# Patient Record
Sex: Female | Born: 1980 | Race: White | Hispanic: No | Smoking: Former smoker
Health system: Southern US, Community
[De-identification: ages and names within clinical notes are randomized; demographics above are authoritative.]

## PROBLEM LIST (undated history)

## (undated) DIAGNOSIS — E079 Disorder of thyroid, unspecified: Secondary | ICD-10-CM

## (undated) DIAGNOSIS — F1010 Alcohol abuse, uncomplicated: Secondary | ICD-10-CM

## (undated) DIAGNOSIS — K7460 Unspecified cirrhosis of liver: Secondary | ICD-10-CM

## (undated) DIAGNOSIS — F329 Major depressive disorder, single episode, unspecified: Secondary | ICD-10-CM

## (undated) DIAGNOSIS — F32A Depression, unspecified: Secondary | ICD-10-CM

## (undated) DIAGNOSIS — F988 Other specified behavioral and emotional disorders with onset usually occurring in childhood and adolescence: Secondary | ICD-10-CM

## (undated) DIAGNOSIS — Z79899 Other long term (current) drug therapy: Secondary | ICD-10-CM

## (undated) HISTORY — PX: APPENDECTOMY: SHX54

## (undated) HISTORY — PX: ABDOMINAL HYSTERECTOMY: SHX81

## (undated) HISTORY — DX: Other long term (current) drug therapy: Z79.899

## (undated) HISTORY — PX: CHOLECYSTECTOMY: SHX55

---

## 2004-02-04 ENCOUNTER — Observation Stay: Payer: Self-pay

## 2004-02-26 ENCOUNTER — Inpatient Hospital Stay: Payer: Self-pay | Admitting: Unknown Physician Specialty

## 2007-05-28 ENCOUNTER — Ambulatory Visit: Payer: Self-pay | Admitting: Endocrinology

## 2008-04-28 ENCOUNTER — Ambulatory Visit: Payer: Self-pay | Admitting: Family Medicine

## 2008-10-21 ENCOUNTER — Ambulatory Visit: Payer: Self-pay | Admitting: Internal Medicine

## 2012-08-05 ENCOUNTER — Ambulatory Visit: Payer: Self-pay | Admitting: Obstetrics and Gynecology

## 2012-08-05 LAB — BASIC METABOLIC PANEL
Anion Gap: 10 (ref 7–16)
BUN: 8 mg/dL (ref 7–18)
Co2: 25 mmol/L (ref 21–32)
EGFR (African American): >60
EGFR (Non-African Amer.): >60
Glucose: 79 mg/dL (ref 65–99)
Potassium: 3.7 mmol/L (ref 3.5–5.1)
Sodium: 139 mmol/L (ref 136–145)

## 2012-08-05 LAB — CBC
HCT: 42.5 % (ref 35.0–47.0)
HGB: 14.6 g/dL (ref 12.0–16.0)
MCH: 35.3 pg — ABNORMAL HIGH (ref 26.0–34.0)
MCHC: 34.3 g/dL (ref 32.0–36.0)
RBC: 4.13 10*6/uL (ref 3.80–5.20)
WBC: 6.4 10*3/uL (ref 3.6–11.0)

## 2012-08-05 LAB — HEPATIC FUNCTION PANEL A (ARMC)
Albumin: 4.1 g/dL (ref 3.4–5.0)
Bilirubin,Total: 0.9 mg/dL (ref 0.2–1.0)
SGOT(AST): 170 U/L — ABNORMAL HIGH (ref 15–37)
SGPT (ALT): 149 U/L — ABNORMAL HIGH (ref 12–78)
Total Protein: 7.5 g/dL (ref 6.4–8.2)

## 2012-08-14 ENCOUNTER — Ambulatory Visit: Payer: Self-pay | Admitting: Obstetrics and Gynecology

## 2012-08-15 LAB — CBC WITH DIFFERENTIAL/PLATELET
Eosinophil #: 0 10*3/uL (ref 0.0–0.7)
HCT: 35.3 % (ref 35.0–47.0)
HGB: 11.8 g/dL — ABNORMAL LOW (ref 12.0–16.0)
MCV: 103 fL — ABNORMAL HIGH (ref 80–100)
Monocyte #: 0.3 x10 3/mm (ref 0.2–0.9)
Monocyte %: 5.9 %
Neutrophil #: 3.6 10*3/uL (ref 1.4–6.5)
Neutrophil %: 74.9 %
Platelet: 106 10*3/uL — ABNORMAL LOW (ref 150–440)
RDW: 13.6 % (ref 11.5–14.5)

## 2012-08-15 LAB — BASIC METABOLIC PANEL
Calcium, Total: 8 mg/dL — ABNORMAL LOW (ref 8.5–10.1)
Chloride: 107 mmol/L (ref 98–107)
Creatinine: 0.62 mg/dL (ref 0.60–1.30)
EGFR (African American): >60
Glucose: 104 mg/dL — ABNORMAL HIGH (ref 65–99)
Osmolality: 271 (ref 275–301)

## 2012-08-19 LAB — URINALYSIS, COMPLETE
Ph: 6 (ref 4.5–8.0)
Protein: NEGATIVE
RBC,UR: 1 /HPF (ref 0–5)
Squamous Epithelial: 1
WBC UR: 3 /HPF (ref 0–5)

## 2012-08-19 LAB — CBC
HCT: 36.4 % (ref 35.0–47.0)
MCH: 34.1 pg — ABNORMAL HIGH (ref 26.0–34.0)
MCHC: 33.5 g/dL (ref 32.0–36.0)
MCV: 102 fL — ABNORMAL HIGH (ref 80–100)
WBC: 6.8 10*3/uL (ref 3.6–11.0)

## 2012-08-19 LAB — PATHOLOGY REPORT

## 2012-08-19 LAB — COMPREHENSIVE METABOLIC PANEL
Albumin: 3.1 g/dL — ABNORMAL LOW (ref 3.4–5.0)
Alkaline Phosphatase: 80 U/L (ref 50–136)
Anion Gap: 7 (ref 7–16)
Bilirubin,Total: 0.4 mg/dL (ref 0.2–1.0)
Calcium, Total: 8.3 mg/dL — ABNORMAL LOW (ref 8.5–10.1)
Chloride: 103 mmol/L (ref 98–107)
Co2: 26 mmol/L (ref 21–32)
Glucose: 93 mg/dL (ref 65–99)
Osmolality: 268 (ref 275–301)
Potassium: 3.5 mmol/L (ref 3.5–5.1)
Total Protein: 6.6 g/dL (ref 6.4–8.2)

## 2012-08-19 LAB — LIPASE, BLOOD: Lipase: 46 U/L — ABNORMAL LOW (ref 73–393)

## 2012-08-20 ENCOUNTER — Observation Stay: Payer: Self-pay | Admitting: Obstetrics and Gynecology

## 2012-08-20 LAB — CBC WITH DIFFERENTIAL/PLATELET
Eosinophil #: 0.2 10*3/uL (ref 0.0–0.7)
Eosinophil %: 3.3 %
HGB: 11.7 g/dL — ABNORMAL LOW (ref 12.0–16.0)
Lymphocyte #: 1.2 10*3/uL (ref 1.0–3.6)
Lymphocyte %: 24.1 %
MCV: 102 fL — ABNORMAL HIGH (ref 80–100)
Neutrophil #: 3 10*3/uL (ref 1.4–6.5)
Neutrophil %: 60.7 %
Platelet: 180 10*3/uL (ref 150–440)

## 2012-08-25 LAB — CULTURE, BLOOD (SINGLE)

## 2012-10-29 ENCOUNTER — Ambulatory Visit: Payer: Self-pay | Admitting: Family Medicine

## 2012-11-21 DIAGNOSIS — N83209 Unspecified ovarian cyst, unspecified side: Secondary | ICD-10-CM | POA: Insufficient documentation

## 2012-11-21 DIAGNOSIS — F1020 Alcohol dependence, uncomplicated: Secondary | ICD-10-CM | POA: Insufficient documentation

## 2012-11-21 DIAGNOSIS — IMO0001 Reserved for inherently not codable concepts without codable children: Secondary | ICD-10-CM | POA: Insufficient documentation

## 2012-11-21 DIAGNOSIS — B182 Chronic viral hepatitis C: Secondary | ICD-10-CM | POA: Insufficient documentation

## 2014-08-26 NOTE — Op Note (Signed)
PATIENT NAMEKAROLE, [name] MR#:  [id number] DATE OF BIRTH:  1980-11-03  DATE OF PROCEDURE:  08/14/2012  PREOPERATIVE DIAGNOSIS: Menorrhagia.   POSTOPERATIVE DIAGNOSIS: Menorrhagia.   OPERATION PERFORMED: Da Vinci assisted total laparoscopic hysterectomy as well as cystoscopy.   ANESTHESIA USED: General.   PRIMARY SURGEON: Florina Ou. Bonney Aid, MD   ASSISTANT: Nira Conn, MD   ESTIMATED BLOOD LOSS: 50 mL.  OPERATIVE FLUIDS: 1400 mL of crystalloid.   URINE OUTPUT: 150 mL of clear urine.   PREOPERATIVE ANTIBIOTICS: 2 grams of Ancef.   DRAINS OR TUBES: Foley to gravity drainage.   IMPLANTS: None.   SPECIMENS REMOVED: Uterus and cervix as well as right tube and left tube.   INTRAOPERATIVE FINDINGS: Normal tubes and ovaries bilaterally with a normal-appearing uterus. Enlarged liver extending well beyond midline with mottled appearance, consistent with known history of cirrhosis. Cystoscopy at the end of the case revealed an intact bladder dome with bilateral ureteral efflux.   COMPLICATIONS: None.   PATIENT CONDITION FOLLOWING PROCEDURE: Stable.   PROCEDURE IN DETAIL: Risks, benefits and alternatives of the procedure were discussed with the patient prior to proceeding to the Operating Room. The patient was taken to the Operating Room where she was administered general endotracheal anesthesia. She was positioned in the dorsal lithotomy position using Allen stirrups. Prior to proceeding with the case, the patient was tested in steep Trendelenburg with acceptable peak inspiratory pressures. Following draping, attention was turned to the patient's vagina. A sterile speculum was placed. The anterior lip of the cervix was visualized. The cervix was serially dilated using Pratt dilators to allow placement of a large VCare. Following intrauterine placement of the VCare, the speculum was removed. The VCare cup was noted to be flush with the cervix prior to continuing the case. Attention was then  turned to the patient's abdomen. A stab incision was made 20 cm above the pubic symphysis, approximately 4 cm superior to the umbilicus. This was infiltrated with 0.5% lidocaine. A Veress needle was then used to gain entry into the intra-abdominal cavity. Two clicks were heard. Water drop test was confirmatory for intraperitoneal placement. The opening pressure was noted to be 4 mmHg. Insufflation was undertaken until a pressure of 15 mmHg was reached. An 8 mm camera trocar was then introduced through the midline incision. Two 8 mm assistant ports were placed laterally under direct visualization, as well as a right 11 mm assistant port. Following placement of the ports, the [employer] robot was docked. A fenestrated bipolar was placed into the arm 2 with a monopolar scissor in arm 1. Inspection of the abdomen and pelvis noted the above findings. Attention was turned to the patient's right tube. The right tube mesosalpinx was cauterized using the fenestrated bipolar and then excised using the monopolar scissors. The utero-ovarian ligament was then cauterized and also transected using the monopolar scissors. Next, the round ligament on the right was cauterized and transected using the monopolar scissors. The anterior leaf of the broad ligament was then dissected down to the level of the internal cervical os and a bladder flap was begun. The posterior mesosalpinx was then skeletonized down to the level of the uterosacral ligament. The uterine artery on the right was skeletonized and then cauterized using the fenestrated bipolar before being transected with the monopolar scissors. The uterine pedicle was then lateralized using the monopolar scissors. There was some bleeding and some lateral cauterization had to be undertaken. The same procedure was then repeated on the patient's left. The  bladder flap anteriorly was brought down to meet the previously created bladder flap, and the bladder was further dissected off the  pubocervical fascia. An anterior colpotomy was then made with the monopolar scissors and using the KOH ring and staying within the groove of the KOH ring, the uterine specimen was excised. The uterus was then delivered vaginally. The cuff was inspected and noted to be hemostatic. Cuff closure was undertaken using two 0 V-Loc 180 sutures, starting at the right cuff edge and working medially, which was then followed by the left cuff edge also working medially to meet in the center. Following this, the pelvis was irrigated and all pedicles inspected and noted to be hemostatic. The pneumoperitoneum was released. The 11 mm port site was closed with a 0 Vicryl on a GU needle. The skin incisions were then closed using Dermabond. Attention was then turned below for the cystoscopy portion of the case. Indigo carmine had been administered to the patient once closure was begun. The cystoscopy revealed an intact bladder dome with no tenting or defect in the bladder and bilateral ureteral efflux of methylene blue dye. The cystoscope was removed and the Foley catheter was replaced. Sponge, needle and instrument counts were correct x 2. The patient tolerated the procedure well, was taken to the recovery room in stable condition.   ____________________________ Florina OuAndreas M. Bonney AidStaebler, MD ams:jm D: 08/14/2012 20:52:32 ET T: 08/15/2012 14:24:18 ET JOB#: [id number]  cc: Florina OuAndreas M. Bonney AidStaebler, MD, <Dictator> Carmel SacramentoANDREAS Cathrine MusterM Seiya Silsby MD ELECTRONICALLY SIGNED 08/19/2012 15:28

## 2015-03-14 ENCOUNTER — Ambulatory Visit
Admission: EM | Admit: 2015-03-14 | Discharge: 2015-03-14 | Disposition: A | Discharge status: Home / Self Care | Payer: Medicaid Other | Attending: Family Medicine | Admitting: Family Medicine

## 2015-03-14 ENCOUNTER — Encounter: Payer: Self-pay | Admitting: Emergency Medicine

## 2015-03-14 ENCOUNTER — Emergency Department: Payer: Medicaid Other

## 2015-03-14 ENCOUNTER — Emergency Department
Admission: EM | Admit: 2015-03-14 | Discharge: 2015-03-15 | Disposition: A | Discharge status: Home / Self Care | Payer: Medicaid Other | Attending: Emergency Medicine | Admitting: Emergency Medicine

## 2015-03-14 DIAGNOSIS — Z880 Allergy status to penicillin: Secondary | ICD-10-CM | POA: Insufficient documentation

## 2015-03-14 DIAGNOSIS — F1020 Alcohol dependence, uncomplicated: Secondary | ICD-10-CM | POA: Insufficient documentation

## 2015-03-14 DIAGNOSIS — F17210 Nicotine dependence, cigarettes, uncomplicated: Secondary | ICD-10-CM | POA: Insufficient documentation

## 2015-03-14 DIAGNOSIS — Z3202 Encounter for pregnancy test, result negative: Secondary | ICD-10-CM | POA: Diagnosis not present

## 2015-03-14 DIAGNOSIS — R1084 Generalized abdominal pain: Secondary | ICD-10-CM | POA: Insufficient documentation

## 2015-03-14 DIAGNOSIS — Z720 Tobacco use: Secondary | ICD-10-CM | POA: Insufficient documentation

## 2015-03-14 DIAGNOSIS — R188 Other ascites: Secondary | ICD-10-CM

## 2015-03-14 DIAGNOSIS — K5900 Constipation, unspecified: Secondary | ICD-10-CM | POA: Insufficient documentation

## 2015-03-14 DIAGNOSIS — K7030 Alcoholic cirrhosis of liver without ascites: Secondary | ICD-10-CM | POA: Diagnosis not present

## 2015-03-14 DIAGNOSIS — K7031 Alcoholic cirrhosis of liver with ascites: Secondary | ICD-10-CM | POA: Diagnosis not present

## 2015-03-14 DIAGNOSIS — R109 Unspecified abdominal pain: Secondary | ICD-10-CM | POA: Diagnosis present

## 2015-03-14 HISTORY — DX: Unspecified cirrhosis of liver: K74.60

## 2015-03-14 HISTORY — DX: Depression, unspecified: F32.A

## 2015-03-14 HISTORY — DX: Alcohol abuse, uncomplicated: F10.10

## 2015-03-14 HISTORY — DX: Major depressive disorder, single episode, unspecified: F32.9

## 2015-03-14 HISTORY — DX: Disorder of thyroid, unspecified: E07.9

## 2015-03-14 HISTORY — DX: Other specified behavioral and emotional disorders with onset usually occurring in childhood and adolescence: F98.8

## 2015-03-14 LAB — COMPREHENSIVE METABOLIC PANEL
ALT: 49 U/L (ref 14–54)
ALT: 52 U/L (ref 14–54)
AST: 100 U/L — ABNORMAL HIGH (ref 15–41)
AST: 103 U/L — AB (ref 15–41)
Albumin: 4.4 g/dL (ref 3.5–5.0)
Albumin: 4.7 g/dL (ref 3.5–5.0)
Alkaline Phosphatase: 50 U/L (ref 38–126)
Alkaline Phosphatase: 56 U/L (ref 38–126)
Anion gap: 12 (ref 5–15)
Anion gap: 7 (ref 5–15)
BILIRUBIN TOTAL: 0.7 mg/dL (ref 0.3–1.2)
BUN: 7 mg/dL (ref 6–20)
BUN: 7 mg/dL (ref 6–20)
CHLORIDE: 105 mmol/L (ref 101–111)
CO2: 26 mmol/L (ref 22–32)
CO2: 28 mmol/L (ref 22–32)
Calcium: 8.8 mg/dL — ABNORMAL LOW (ref 8.9–10.3)
Calcium: 8.7 mg/dL — ABNORMAL LOW (ref 8.9–10.3)
Chloride: 103 mmol/L (ref 101–111)
Creatinine, Ser: 0.54 mg/dL (ref 0.44–1.00)
Creatinine, Ser: 0.54 mg/dL (ref 0.44–1.00)
GFR calc Af Amer: >60 mL/min (ref >60)
Glucose, Bld: 85 mg/dL (ref 65–99)
Glucose, Bld: 107 mg/dL — ABNORMAL HIGH (ref 65–99)
POTASSIUM: 3.6 mmol/L (ref 3.5–5.1)
POTASSIUM: 3.4 mmol/L — AB (ref 3.5–5.1)
Sodium: 141 mmol/L (ref 135–145)
Sodium: 140 mmol/L (ref 135–145)
TOTAL PROTEIN: 7.4 g/dL (ref 6.5–8.1)
Total Bilirubin: 0.6 mg/dL (ref 0.3–1.2)
Total Protein: 7.6 g/dL (ref 6.5–8.1)

## 2015-03-14 LAB — CBC WITH DIFFERENTIAL/PLATELET
BASOS ABS: 0.1 10*3/uL (ref 0–0.1)
BASOS PCT: 1 %
Basophils Absolute: 0 10*3/uL (ref 0–0.1)
Basophils Relative: 1 %
EOS PCT: 2 %
EOS PCT: 2 %
Eosinophils Absolute: 0.1 10*3/uL (ref 0–0.7)
Eosinophils Absolute: 0.1 10*3/uL (ref 0–0.7)
HCT: 40.1 % (ref 35.0–47.0)
HCT: 41.7 % (ref 35.0–47.0)
Hemoglobin: 13.7 g/dL (ref 12.0–16.0)
Hemoglobin: 13.8 g/dL (ref 12.0–16.0)
LYMPHS ABS: 3.5 10*3/uL (ref 1.0–3.6)
LYMPHS PCT: 61 %
Lymphocytes Relative: 57 %
Lymphs Abs: 2.9 10*3/uL (ref 1.0–3.6)
MCH: 34.3 pg — AB (ref 26.0–34.0)
MCH: 33.8 pg (ref 26.0–34.0)
MCHC: 34.2 g/dL (ref 32.0–36.0)
MCHC: 33.2 g/dL (ref 32.0–36.0)
MCV: 100.3 fL — AB (ref 80.0–100.0)
MCV: 101.8 fL — AB (ref 80.0–100.0)
MONO ABS: 0.3 10*3/uL (ref 0.2–0.9)
MONOS PCT: 7 %
Monocytes Absolute: 0.4 10*3/uL (ref 0.2–0.9)
Monocytes Relative: 6 %
Neutro Abs: 2 10*3/uL (ref 1.4–6.5)
Neutro Abs: 1.5 10*3/uL (ref 1.4–6.5)
Neutrophils Relative %: 33 %
Neutrophils Relative %: 30 %
PLATELETS: 132 10*3/uL — AB (ref 150–440)
PLATELETS: 132 10*3/uL — AB (ref 150–440)
RBC: 4 MIL/uL (ref 3.80–5.20)
RBC: 4.1 MIL/uL (ref 3.80–5.20)
RDW: 14.6 % — AB (ref 11.5–14.5)
RDW: 14.6 % — AB (ref 11.5–14.5)
WBC: 6 10*3/uL (ref 3.6–11.0)
WBC: 4.8 10*3/uL (ref 3.6–11.0)

## 2015-03-14 LAB — ETHANOL: Alcohol, Ethyl (B): 243 mg/dL — ABNORMAL HIGH (ref <5)

## 2015-03-14 LAB — URINALYSIS COMPLETE WITH MICROSCOPIC (ARMC ONLY)
BILIRUBIN URINE: NEGATIVE
Bacteria, UA: NONE SEEN
Bilirubin Urine: NEGATIVE
Glucose, UA: NEGATIVE mg/dL
Glucose, UA: NEGATIVE mg/dL
HGB URINE DIPSTICK: NEGATIVE
Hgb urine dipstick: NEGATIVE
Ketones, ur: NEGATIVE mg/dL
LEUKOCYTES UA: NEGATIVE
Leukocytes, UA: NEGATIVE
NITRITE: NEGATIVE
NITRITE: NEGATIVE
PH: 5 (ref 5.0–8.0)
PROTEIN: NEGATIVE mg/dL
Protein, ur: NEGATIVE mg/dL
RBC / HPF: NONE SEEN RBC/hpf (ref <3)
SPECIFIC GRAVITY, URINE: 1.01 (ref 1.005–1.030)
SPECIFIC GRAVITY, URINE: 1.008 (ref 1.005–1.030)
pH: 5 (ref 5.0–8.0)

## 2015-03-14 LAB — AMYLASE: AMYLASE: 99 U/L (ref 28–100)

## 2015-03-14 LAB — LIPASE, BLOOD
LIPASE: 30 U/L (ref 11–51)
LIPASE: 32 U/L (ref 11–51)

## 2015-03-14 LAB — POCT PREGNANCY, URINE: Preg Test, Ur: NEGATIVE

## 2015-03-14 LAB — PREGNANCY, URINE: Preg Test, Ur: NEGATIVE

## 2015-03-14 MED ORDER — OXYCODONE HCL 5 MG PO TABS
5.0000 mg | ORAL_TABLET | Freq: Once | ORAL | Status: AC
  Administered 2015-03-14: 5 mg via ORAL
  Filled 2015-03-14: qty 1

## 2015-03-14 MED ORDER — NICOTINE 21 MG/24HR TD PT24
21.0000 mg | MEDICATED_PATCH | Freq: Once | TRANSDERMAL | Status: DC
  Administered 2015-03-14: 21 mg via TRANSDERMAL
  Filled 2015-03-14: qty 1

## 2015-03-14 NOTE — Discharge Instructions (Signed)
Alcoholic Liver Disease The liver is an organ that converts food into energy, absorbs vitamins from food, removes toxins from the blood, and makes proteins. Alcoholic liver disease happens when the liver becomes damaged due to alcohol consumption and it stops working properly. CAUSES This condition is caused by drinking too much alcohol over a number of years. RISK FACTORS This condition is more likely to develop in:  Women.  People who have a family history of the disease.  People who have poor nutrition.  People who are obese. SYMPTOMS Early symptoms of this condition include:  Fatigue.  Weakness.  Abdominal discomfort on the upper right side. Symptoms of moderate disease include:  Fever.  Yellow, pale, or darkening skin.  Abdominal pain.  Nausea and vomiting.  Weight loss.  Loss of appetite. Symptoms of advanced disease include:  Abdominal swelling.  Nosebleeds or bleeding gums.  Itchy skin.  Enlarged fingertips (clubbing).  Light-colored stools that smell very bad (steatorrhea).  Mood changes.  Feeling agitated.  Confusion.  Trouble concentrating. Some people do not have symptoms until the condition becomes severe. Symptoms are often worse after heavy drinking. DIAGNOSIS This condition is diagnosed with:  A physical exam.  Blood tests.  Taking a sample of liver tissue to examine under a microscope (liver biopsy). You may also have other tests, including:   X-rays.  Ultrasound. TREATMENT Treatment may include:  Stopping alcohol use to allow the liver to heal.  Joining a support group or meeting with a counselor.  Medicines to reduce inflammation. These may be recommended if the disease is severe.  A liver transplant. This is the only treatment if the disease is very severe.  Nutritional therapy. This may involve:  Taking vitamins.  Eating foods that are high in thiamine, such as whole-wheat cereals, pork, and raw  vegetables.  Eating foods that have a lot of folic acid, such as vegetables, fruits, meats, beans, nuts, and dairy foods.  Eating a diet that includes carbohydrate-rich foods, such as yogurt, beans, potatoes, and rice. HOME CARE INSTRUCTIONS  Do not drink alcohol.  Take medicines only as directed by your health care provider.  Take vitamins only as directed by your health care provider.  Follow any diet instructions that are given to you by your health care provider. SEEK MEDICAL CARE IF:  You have a fever.  You have shortness of breath or have difficulty breathing.  You have bright red blood in your stool, or you have black, tarry stools.  You are vomiting blood.  Your skin color becomes more yellow, pale, or dark.  You develop headaches.  You have trouble thinking.  You have problems balancing or walking.   This information is not intended to replace advice given to you by your health care provider. Make sure you discuss any questions you have with your health care provider.   Document Released: 05/13/2014 Document Reviewed: 05/13/2014 Elsevier Interactive Patient Education 2016 [employer].  Cirrhosis Cirrhosis is long-term (chronic) liver injury. The liver is your largest internal organ, and it performs many functions. The liver converts food into energy, removes toxic material from your blood, makes important proteins, and absorbs necessary vitamins from your diet. If you have cirrhosis, it means many of your healthy liver cells have been replaced by scar tissue. This prevents blood from flowing through your liver, which makes it difficult for your liver to function. This scarring is not reversible, but treatment can prevent it from getting worse.  CAUSES  Hepatitis C and long-term  alcohol abuse are the most common causes of cirrhosis. Other causes include:  Nonalcoholic fatty liver disease.  Hepatitis B infection.  Autoimmune hepatitis.  Diseases that cause  blockage of ducts inside the liver.  Inherited liver diseases.  Reactions to certain long-term medicines.  Parasitic infections.  Long-term exposure to certain toxins. RISK FACTORS You may have a higher risk of cirrhosis if you:  Have certain hepatitis viruses.  Abuse alcohol, especially if you are female.  Are overweight.  Share needles.  Have unprotected sex with someone who has hepatitis. SYMPTOMS  You may not have any signs and symptoms at first. Symptoms may not develop until the damage to your liver starts to get worse. Signs and symptoms of cirrhosis may include:   Tenderness in the right-upper part of your abdomen.  Weakness and tiredness (fatigue).  Loss of appetite.  Nausea.  Weight loss and muscle loss.  Itchiness.  Yellow skin and eyes (jaundice).  Buildup of fluid in the abdomen (ascites).  Swelling of the feet and ankles (edema).  Appearance of tiny blood vessels under the skin.  Mental confusion.  Easy bruising and bleeding. DIAGNOSIS  Your health care provider may suspect cirrhosis based on your symptoms and medical history, especially if you have other medical conditions or a history of alcohol abuse. Your health care provider will do a physical exam to feel your liver and check for signs of cirrhosis. Your health care provider may perform other tests, including:   Blood tests to check:   Whether you have hepatitis B or C.   Kidney function.  Liver function.  Imaging tests such as:  MRI or CT scan to look for changes seen in advanced cirrhosis.  Ultrasound to see if normal liver tissue is being replaced by scar tissue.  A procedure using a long needle to take a sample of liver tissue (biopsy) for examination under a microscope. Liver biopsy can confirm the diagnosis of cirrhosis.  TREATMENT  Treatment depends on how damaged your liver is and what caused the damage. Treatment may include treating cirrhosis symptoms or treating the  underlying causes of the condition to try to slow the progression of the damage. Treatment may include:  Making lifestyle changes, such as:   Eating a healthy diet.  Restricting salt intake.  Maintaining a healthy weight.   Not abusing drugs or alcohol.  Taking medicines to:  Treat liver infections or other infections.  Control itching.  Reduce fluid buildup.  Reduce certain blood toxins.  Reduce risk of bleeding from enlarged blood vessels in the stomach or esophagus (varices).  If varices are causing bleeding problems, you may need treatment with a procedure that ties up the vessels causing them to fall off (band ligation).  If cirrhosis is causing your liver to fail, your health care provider may recommend a liver transplant.  Other treatments may be recommended depending on any complications of cirrhosis, such as liver-related kidney failure (hepatorenal syndrome). HOME CARE INSTRUCTIONS   Take medicines only as directed by your health care provider. Do not use drugs that are toxic to your liver. Ask your health care provider before taking any new medicines, including over-the-counter medicines.   Rest as needed.  Eat a well-balanced diet. Ask your health care provider or dietitian for more information.   You may have to follow a low-salt diet or restrict your water intake as directed.  Do not drink alcohol. This is especially important if you are taking acetaminophen.  Keep all follow-up visits  as directed by your health care provider. This is important. SEEK MEDICAL CARE IF:  You have fatigue or weakness that is getting worse.  You develop swelling of the hands, feet, legs, or face.  You have a fever.  You develop loss of appetite.  You have nausea or vomiting.  You develop jaundice.  You develop easy bruising or bleeding. SEEK IMMEDIATE MEDICAL CARE IF:  You vomit bright red blood or a material that looks like coffee grounds.  You have blood in  your stools.  Your stools appear black and tarry.  You become confused.  You have chest pain or trouble breathing.   This information is not intended to replace advice given to you by your health care provider. Make sure you discuss any questions you have with your health care provider.   Document Released: 04/22/2005 Document Revised: 05/13/2014 Document Reviewed: 12/29/2013 Elsevier Interactive Patient Education [employer]2016 Elsevier Inc.

## 2015-03-14 NOTE — ED Notes (Signed)
Spoke with lab about ethanol results for pt, lab states they are working on it.

## 2015-03-14 NOTE — ED Provider Notes (Signed)
[facility] Emergency Department Provider Note     Time seen: ----------------------------------------- 10:24 PM on 03/14/2015 -----------------------------------------    I have reviewed the triage vital signs and the nursing notes.   HISTORY  Chief Complaint Abdominal Pain    HPI [name] is a 34 y.o. female who presents to ER for abdominal pain. Patient states she's been pain for about a week, went to [facility] urgent care today was diagnosed with generalized abdominal pain and cirrhosis of liver. Patient admits to drinking half a pint of liquor today. Patient's been taking heating pad and ibuprofen but that hasn't helped. Nothing makes her abdominal pain better or worse.   Past Medical History  Diagnosis Date  . Alcohol abuse   . Thyroid disease   . ADD (attention deficit disorder)   . Cirrhosis (HCC)   . Depression     There are no active problems to display for this patient.   Past Surgical History  Procedure Laterality Date  . Appendectomy    . Cholecystectomy    . Abdominal hysterectomy      Allergies Penicillins  Social History Social History  Substance Use Topics  . Smoking status: Current Every Day Smoker -- 2.00 packs/day    Types: Cigarettes  . Smokeless tobacco: None  . Alcohol Use: Yes     Comment: excessively    Review of Systems Constitutional: Negative for fever. Eyes: Negative for visual changes. ENT: Negative for sore throat. Cardiovascular: Negative for chest pain. Respiratory: Negative for shortness of breath. Gastrointestinal: Positive for abdominal pain, negative for vomiting or diarrhea. Positive for constipation Genitourinary: Negative for dysuria. Musculoskeletal: Negative for back pain. Skin: Negative for rash. Neurological: Negative for headaches, focal weakness or numbness.  10-point ROS otherwise negative.  ____________________________________________   PHYSICAL EXAM:  VITAL SIGNS: ED  Triage Vitals  Enc Vitals Group     BP --      Pulse --      Resp --      Temp --      Temp src --      SpO2 --      Weight --      Height --      Head Cir --      Peak Flow --      Pain Score 03/14/15 2013 7     Pain Loc --      Pain Edu? --      Excl. in GC? --     Constitutional: Alert and oriented. Well appearing and in no distress. Patient appears intoxicated Eyes: Conjunctivae are normal. PERRL. Normal extraocular movements. ENT   Head: Normocephalic and atraumatic.   Nose: No congestion/rhinnorhea.   Mouth/Throat: Mucous membranes are moist.   Neck: No stridor. Cardiovascular: Normal rate, regular rhythm. Normal and symmetric distal pulses are present in all extremities. No murmurs, rubs, or gallops. Respiratory: Normal respiratory effort without tachypnea nor retractions. Breath sounds are clear and equal bilaterally. No wheezes/rales/rhonchi. Gastrointestinal: Distention, mild right-sided abdominal tenderness. No rebound or guarding. Normal bowel sounds. Musculoskeletal: Nontender with normal range of motion in all extremities. No joint effusions.  No lower extremity tenderness nor edema. Neurologic:  Normal speech and language. No gross focal neurologic deficits are appreciated. Speech is normal. No gait instability. Skin:  Skin is warm, dry and intact. No rash noted. Psychiatric: Mood and affect are normal. Speech and behavior are normal. Patient exhibits appropriate insight and judgment.  ____________________________________________  ED COURSE:  Pertinent labs &  imaging results that were available during my care of the patient were reviewed by me and considered in my medical decision making (see chart for details). Patient is in no acute distress, likely cirrhosis with chronic alcoholism. ____________________________________________    LABS (pertinent positives/negatives)  Labs Reviewed  CBC WITH DIFFERENTIAL/PLATELET - Abnormal; Notable for the  following:    MCV 101.8 (*)    RDW 14.6 (*)    Platelets 132 (*)    All other components within normal limits  COMPREHENSIVE METABOLIC PANEL - Abnormal; Notable for the following:    Potassium 3.4 (*)    Glucose, Bld 107 (*)    Calcium 8.7 (*)    AST 103 (*)    All other components within normal limits  URINALYSIS COMPLETEWITH MICROSCOPIC ([facility] ONLY) - Abnormal; Notable for the following:    Color, Urine YELLOW (*)    APPearance CLEAR (*)    Ketones, ur TRACE (*)    Squamous Epithelial / LPF 0-5 (*)    All other components within normal limits  LIPASE, BLOOD  PREGNANCY, URINE  ETHANOL  POCT PREGNANCY, URINE    RADIOLOGY Images were viewed by me  Abdomen 2 view Is unremarkable ____________________________________________  FINAL ASSESSMENT AND PLAN  Abdominal pain, cirrhosis, chronic alcoholism  Plan: Patient with labs and imaging as dictated above. Patient is no acute distress, altered status pending is checked out to Dr. Manson PasseyBrown at the time of discharge. Patient remains in stable condition at this time.   Emily FilbertWilliams, Alverda Nazzaro E, MD   Emily FilbertJonathan E Otelia Hettinger, MD 03/14/15 [phone]

## 2015-03-14 NOTE — ED Notes (Signed)
Pt presents with right sided abdominal pain that is tender to touch. Pt states that she has been in pain for about a week. Pt went to [facility] today and was diagnosed with generalized abdominal pain and cirrhosis of the liver. Pt states she has used a heating pad and ibuprofen but states that doesn't help. Pt states "I assume the only fix is for me to stop drinking."

## 2015-03-14 NOTE — Discharge Instructions (Signed)
Abdominal Pain, Adult Many things can cause belly (abdominal) pain. Most times, the belly pain is not dangerous. Many cases of belly pain can be watched and treated at home. HOME CARE   Do not take medicines that help you go poop (laxatives) unless told to by your doctor.  Only take medicine as told by your doctor.  Eat or drink as told by your doctor. Your doctor will tell you if you should be on a special diet. GET HELP IF:  You do not know what is causing your belly pain.  You have belly pain while you are sick to your stomach (nauseous) or have runny poop (diarrhea).  You have pain while you pee or poop.  Your belly pain wakes you up at night.  You have belly pain that gets worse or better when you eat.  You have belly pain that gets worse when you eat fatty foods.  You have a fever. GET HELP RIGHT AWAY IF:   The pain does not go away within 2 hours.  You keep throwing up (vomiting).  The pain changes and is only in the right or left part of the belly.  You have bloody or tarry looking poop. MAKE SURE YOU:   Understand these instructions.  Will watch your condition.  Will get help right away if you are not doing well or get worse.   This information is not intended to replace advice given to you by your health care provider. Make sure you discuss any questions you have with your health care provider.   Document Released: 10/09/2007 Document Revised: 05/13/2014 Document Reviewed: 12/30/2012 Elsevier Interactive Patient Education 2016 Elsevier Inc.  Alcoholic Liver Disease Alcoholic liver disease happens when the liver does not work the way it should. The condition is caused by drinking too much alcohol for many years.  HOME CARE  Do not drink alcohol.  Take medicines only as told by your doctor.  Take vitamins only as told by your doctor.  Follow any diet instructions that your doctor gave you. You may need to:  Eat foods that have thiamine. These include  whole-wheat cereals, pork, and raw vegetables.  Eat foods that have folic acid. These include vegetables, fruits, meats, beans, nuts, and dairy foods.  Eat foods that are high in carbohydrates. These include yogurt, beans, potatoes, and rice. GET HELP IF:  You have a fever.  You are short of breath.  You have trouble breathing.  You have bright red blood in your poop (stool).  Your poop looks like tar.  You throw up (vomit) blood.  Your skin looks more yellow, pale, or dark.  You get headaches.  You have trouble thinking.  You have trouble balancing or walking.   This information is not intended to replace advice given to you by your health care provider. Make sure you discuss any questions you have with your health care provider.   Document Released: 02/17/2009 Document Revised: 09/06/2014 Document Reviewed: 03/24/2014 Elsevier Interactive Patient Education 2016 Elsevier Inc.  Ascites Ascites is a collection of excess fluid in the abdomen. Ascites can range from mild to severe. It can get worse without treatment. CAUSES Possible causes include:  Cirrhosis. This is the most common cause of ascites.  Infection or inflammation in the abdomen.  Cancer in the abdomen.  Heart failure.  Kidney disease.  Inflammation of the pancreas.  Clots in the veins of the liver. SIGNS AND SYMPTOMS Signs and symptoms may include:  A feeling of fullness  get worse without treatment.  CAUSES  Possible causes include:  · Cirrhosis. This is the most common cause of ascites.  · Infection or inflammation in the abdomen.  · Cancer in the abdomen.  · Heart failure.  · Kidney disease.  · Inflammation of the pancreas.  · Clots in the veins of the liver.  SIGNS AND SYMPTOMS  Signs and symptoms may include:  · A feeling of fullness in your abdomen. This is common.  · An increase in the size of your abdomen or your waist.  · Swelling in your legs.  · Swelling of the scrotum in men.  · Difficulty breathing.  · Abdominal pain.  · Sudden weight gain.  If the condition is mild, you may not have symptoms.  DIAGNOSIS  To make a diagnosis, your health care provider will:  · Ask about your medical history.  · Perform a physical exam.  · Order imaging tests, such as an ultrasound or CT scan of your abdomen.  TREATMENT  Treatment depends on the cause of the  ascites. It may include:  · Taking a pill to make you urinate. This is called a water pill (diuretic pill).  · Strictly reducing your salt (sodium) intake. Salt can cause extra fluid to be kept in the body, and this makes ascites worse.  · Having a procedure to remove fluid from your abdomen (paracentesis).  · Having a procedure to transfer fluid from your abdomen into a vein.  · Having a procedure that connects two of the major veins within your liver and relieves pressure on your liver (TIPS procedure).  Ascites may go away or improve with treatment of the condition that caused it.   HOME CARE INSTRUCTIONS  · Keep track of your weight. To do this, weigh yourself at the same time every day and record your weight.  · Keep track of how much you drink and any changes in the amount you urinate.  · Follow any instructions that your health care provider gives you about how much to drink.  · Try not to eat salty (high-sodium) foods.  · Take medicines only as directed by your health care provider.  · Keep all follow-up visits as directed by your health care provider. This is important.  · Report any changes in your health to your health care provider, especially if you develop new symptoms or your symptoms get worse.  SEEK MEDICAL CARE IF:  · Your gain more than 3 pounds in 3 days.  · Your abdominal size or your waist size increases.  · You have new swelling in your legs.  · The swelling in your legs gets worse.  SEEK IMMEDIATE MEDICAL CARE IF:  · You develop a fever.  · You develop confusion.  · You develop new or worsening difficulty breathing.  · You develop new or worsening abdominal pain.  · You develop new or worsening swelling in the scrotum (in men).     This information is not intended to replace advice given to you by your health care provider. Make sure you discuss any questions you have with your health care provider.     Document Released: 04/22/2005 Document Revised: 05/13/2014 Document Reviewed:  11/19/2013  Elsevier Interactive Patient Education ©2016 Elsevier Inc.

## 2015-03-14 NOTE — ED Notes (Addendum)
Patient ambulatory to triage with steady gait, without difficulty or distress noted; pt c/o right sided abd pain last few days accomp by hematuria; st hx of same due to liver; [facility] and was informed that she would need a "CT scan, possible admission for pancreatitis"

## 2015-03-14 NOTE — ED Notes (Signed)
Pt called out to RN desk requesting nicotine patch and having IV removed. Informed pt that IV is kept in case medication or further testing is needed. Pt verbalized understanding and states okay for IV to be kept.

## 2015-03-14 NOTE — ED Provider Notes (Signed)
CSN: [id number]     Arrival date & time 03/14/15  1816 History   First MD Initiated Contact with Patient 03/14/15 1917    Nurses notes were reviewed. Chief Complaint  Patient presents with  . Abdominal Pain   patients who have abdominal pain for last 3 days she also reports some abdominal distention as well. She's had a history of panic attacks before and about 3 weeks ago she started drinking again after not drinking. She has history of cirrhosis of the liver as well. She does smoke (Consider location/radiation/quality/duration/timing/severity/associated sxs/prior Treatment) HPI  Past Medical History  Diagnosis Date  . Alcohol abuse   . Thyroid disease   . ADD (attention deficit disorder)   . Cirrhosis (HCC)   . Depression    Past Surgical History  Procedure Laterality Date  . Appendectomy    . Cholecystectomy    . Abdominal hysterectomy     Family History  Problem Relation Age of Onset  . Cancer Father   . Hypertension Father    Social History  Substance Use Topics  . Smoking status: Current Every Day Smoker -- 2.00 packs/day    Types: Cigarettes  . Smokeless tobacco: None  . Alcohol Use: Yes     Comment: excessively   OB History    No data available     Review of Systems  Allergies  Penicillins  Home Medications   Prior to Admission medications   Not on File   Meds Ordered and Administered this Visit  Medications - No data to display  BP 136/65 mmHg  Pulse 106  Temp(Src) 96.5 F (35.8 C) (Tympanic)  Resp 16  Ht 5' 3.5" (1.613 m)  Wt 119 lb (53.978 kg)  BMI 20.75 kg/m2  SpO2 100% No data found.   Physical Exam  Constitutional: She is oriented to person, place, and time. She appears well-developed and well-nourished. She appears distressed.  HENT:  Head: Normocephalic and atraumatic.  Right Ear: External ear normal.  Eyes: Conjunctivae are normal. Pupils are equal, round, and reactive to light.  Neck: Normal range of motion. Neck supple. No  tracheal deviation present. No thyromegaly present.  Cardiovascular: Normal rate, regular rhythm and normal heart sounds.   Pulmonary/Chest: Effort normal.  Abdominal: Normal appearance. She exhibits distension and fluid wave. There is no hepatosplenomegaly. There is tenderness. There is no CVA tenderness.  Patient has fluid present on abdomen consistent with ascites and has a fluid wave.  Musculoskeletal: Normal range of motion.  Neurological: She is alert and oriented to person, place, and time. No cranial nerve deficit.  No signs of asterixis present at this time.  Skin: Skin is warm and dry.  Psychiatric: She has a normal mood and affect. Her behavior is normal.  Vitals reviewed.   ED Course  Procedures (including critical care time)  Labs Review Labs Reviewed  URINALYSIS COMPLETEWITH MICROSCOPIC ([facility] ONLY) - Abnormal; Notable for the following:    Squamous Epithelial / LPF 0-5 (*)    All other components within normal limits  URINE CULTURE  CBC WITH DIFFERENTIAL/PLATELET  COMPREHENSIVE METABOLIC PANEL  AMYLASE  LIPASE, BLOOD    Imaging Review No results found.   Visual Acuity Review  Right Eye Distance:   Left Eye Distance:   Bilateral Distance:    Right Eye Near:   Left Eye Near:    Bilateral Near:         MDM   1. Generalized abdominal pain   2. Ascites   3.  Alcoholic cirrhosis of liver with ascites [facility](HCC)     Patient was sent to [facility] ED report was called in to charge nurse Larita FifeLynn. We'll run CBC CMP amylase and lipase here so they'll have it for the ED but I did not the patient wait for those results to come in before we send her to the ED. Patient father was informed that I was concerned that her cirrhosis has progressed his ascites and that she has pancreatitis.  Hassan RowanEugene Serina Nichter, MD 03/14/15 1946

## 2015-03-14 NOTE — ED Notes (Addendum)
Admits to having a problem with ETOH. C/o LUQ pain, worsening over the last 2 days. States has drank approximately 8 oz vodka today. States had hematuria 2 days ago. +Slight nausea. Been constipated also. C/o "bloated big belly"

## 2015-03-15 ENCOUNTER — Emergency Department: Payer: Medicaid Other

## 2015-03-15 NOTE — ED Notes (Signed)
Patient transported to Ultrasound via stretcher 

## 2015-03-15 NOTE — ED Notes (Signed)
Pt reports pain medication did not relieve pain. Dr. Manson PasseyBrown notified of pt pain.

## 2015-03-15 NOTE — ED Notes (Signed)

## 2015-03-16 LAB — URINE CULTURE

## 2015-04-05 ENCOUNTER — Ambulatory Visit (INDEPENDENT_AMBULATORY_CARE_PROVIDER_SITE_OTHER): Payer: Medicaid Other | Admitting: Family Medicine

## 2015-04-05 ENCOUNTER — Encounter: Payer: Self-pay | Admitting: Family Medicine

## 2015-04-05 VITALS — BP 110/68 | HR 127 | Temp 98.8°F | Resp 18 | Wt 120.3 lb

## 2015-04-05 DIAGNOSIS — K7030 Alcoholic cirrhosis of liver without ascites: Secondary | ICD-10-CM | POA: Diagnosis not present

## 2015-04-05 DIAGNOSIS — Z8742 Personal history of other diseases of the female genital tract: Secondary | ICD-10-CM | POA: Diagnosis not present

## 2015-04-05 DIAGNOSIS — F10180 Alcohol abuse with alcohol-induced anxiety disorder: Secondary | ICD-10-CM

## 2015-04-05 DIAGNOSIS — K7460 Unspecified cirrhosis of liver: Secondary | ICD-10-CM | POA: Insufficient documentation

## 2015-04-05 DIAGNOSIS — R1032 Left lower quadrant pain: Secondary | ICD-10-CM

## 2015-04-05 DIAGNOSIS — J309 Allergic rhinitis, unspecified: Secondary | ICD-10-CM | POA: Insufficient documentation

## 2015-04-05 DIAGNOSIS — F902 Attention-deficit hyperactivity disorder, combined type: Secondary | ICD-10-CM | POA: Diagnosis not present

## 2015-04-05 DIAGNOSIS — F418 Other specified anxiety disorders: Secondary | ICD-10-CM | POA: Insufficient documentation

## 2015-04-05 DIAGNOSIS — E039 Hypothyroidism, unspecified: Secondary | ICD-10-CM | POA: Insufficient documentation

## 2015-04-05 DIAGNOSIS — K219 Gastro-esophageal reflux disease without esophagitis: Secondary | ICD-10-CM | POA: Insufficient documentation

## 2015-04-05 MED ORDER — DEXTROAMPHETAMINE SULFATE 10 MG PO TABS: 15.0000 mg | ORAL_TABLET | Freq: Two times a day (BID) | ORAL | Status: DC

## 2015-04-05 NOTE — Progress Notes (Signed)
Name: [name]   MRN: [id number]    DOB: 08/11/1980   Date:04/05/2015       Progress Note  Subjective  Chief Complaint  Chief Complaint  Patient presents with  . Establish Care  . Abdominal Pain    patient has been having some left lower quadrant pain. hx of ovarian cyst.    HPI  [name] is a 34 y.o. female here today to transition care of medical needs to a primary care provider. She recently was seen in the ER about 3 weeks ago with complaints of abdominal pain. Imaging done at the time found nothing acute, just stable s/p cholecystectomy and "Increased and patchy hepatic echogenicity, may reflect steatosis or chronic liver disease."  There is record of alcohol abuse. She confirms this today. About 1.5 years ago was seeing a Hepatology specialist at [facility], now lost to care. Today however she is concerned regarding LLQ abdominal/pelvic pain, history of ovarian cysts reported since teenage years. She has had a hysterectomy but ovaries are intact. She would like to consult with her gynecologist at [facility] regarding her LLQ pain.   Lab work done at the ER shows negative pregnancy testing, no UTI, low platelets (possibly consistent with chronic liver disorder), low potassium and calcium otherwise no CKD. Normal lipase. Alcohol level at the time elevated.  She is also requesting refill of her ADHD medication Dextrostat 10 mg taking 15mg po bid. Verified with pharmacy, last refill was 03/22/15 for quantity ~ #90.  The condition is characterized as fidgeting, loses items necessary for activity, interrupting others, poor attention span, shifting from one uncompleted task to another and easily distracted but not excessive talking, difficulty remaining seated, difficulty waiting for her turn, engages in physically dangerous activities, blurting out answers before question is complete, difficulty following instructions or antisocial behavior. There has been no associated hearing  difficulties, vision disturbances, difficulty speaking or sleeping.   Past Medical History  Diagnosis Date  . Alcohol abuse   . Thyroid disease   . ADD (attention deficit disorder)   . Cirrhosis (HCC)   . Depression     Patient Active Problem List   Diagnosis Date Noted  . Hypothyroidism (acquired) 04/05/2015  . GERD without esophagitis 04/05/2015  . Allergic rhinitis 04/05/2015    Social History  Substance Use Topics  . Smoking status: Current Every Day Smoker -- 2.00 packs/day    Types: Cigarettes  . Smokeless tobacco: Not on file  . Alcohol Use: 0.0 oz/week    0 Standard drinks or equivalent per week     Comment: excessively     Current outpatient prescriptions:  .  dextroamphetamine (DEXTROSTAT) 10 MG tablet, Take 15 mg by mouth 2 (two) times daily., Disp: , Rfl:  .  diclofenac sodium (VOLTAREN) 1 % GEL, Apply 2 g topically daily as needed., Disp: , Rfl:  .  fluticasone (FLONASE) 50 MCG/ACT nasal spray, Place 2 sprays into both nostrils daily., Disp: , Rfl:  .  levothyroxine (SYNTHROID, LEVOTHROID) 25 MCG tablet, Take 25 mcg by mouth daily., Disp: , Rfl:  .  LORazepam (ATIVAN) 0.5 MG tablet, Take 1 mg by mouth every 8 (eight) hours as needed., Disp: , Rfl:  .  ranitidine (ZANTAC) 300 MG tablet, Take 300 mg by mouth every evening., Disp: , Rfl:   Past Surgical History  Procedure Laterality Date  . Appendectomy    . Cholecystectomy    . Abdominal hysterectomy      Family History  Problem Relation   Age of Onset  . Cancer Father   . Hypertension Father     Allergies  Allergen Reactions  . Penicillins Rash     Review of Systems  CONSTITUTIONAL: No significant weight changes, fever, chills, weakness or fatigue.  HEENT:  - Eyes: No visual changes.  - Ears: No auditory changes. No pain.  - Nose: No sneezing, congestion, runny nose. - Throat: No sore throat. No changes in swallowing. SKIN: No rash or itching.  CARDIOVASCULAR: No chest pain, chest pressure or  chest discomfort. No palpitations or edema.  RESPIRATORY: No shortness of breath, cough or sputum.  GASTROINTESTINAL: No anorexia, nausea, vomiting. No changes in bowel habits. Today reports LLQ pelvic/abdominal pain. GENITOURINARY: No dysuria. No frequency. No discharge.  NEUROLOGICAL: No headache, dizziness, syncope, paralysis, ataxia, numbness or tingling in the extremities. No memory changes. No change in bowel or bladder control.  MUSCULOSKELETAL: No joint pain. No muscle pain. HEMATOLOGIC: No anemia, bleeding or bruising.  LYMPHATICS: No enlarged lymph nodes.  PSYCHIATRIC: No change in mood. No change in sleep pattern.  ENDOCRINOLOGIC: No reports of sweating, cold or heat intolerance. No polyuria or polydipsia.     Objective  BP 110/68 mmHg  Pulse 127  Temp(Src) 98.8 F (37.1 C) (Oral)  Resp 18  Wt 120 lb 4.8 oz (54.568 kg)  SpO2 98% Body mass index is 20.97 kg/(m^2).  Physical Exam  Constitutional: Patient appears well-developed and well-nourished. In no distress. .  Cardiovascular: Normal rate, regular rhythm and normal heart sounds.  No murmur heard.  Pulmonary/Chest: Effort normal and breath sounds normal. No respiratory distress. Abdomen: Soft, flat. Tenderness at LLQ within pelvic area with some guarding. No HSM. Normal bowel sounds.  Musculoskeletal: Normal range of motion bilateral UE and LE, no joint effusions. Peripheral vascular: Bilateral LE no edema. Neurological: CN II-XII grossly intact with no focal deficits. Alert and oriented to person, place, and time. Coordination, balance, strength, speech and gait are normal.  Skin: Skin is warm and dry. No rash noted. No erythema.  Psychiatric: Patient has a anxious mood and affect. Behavior is normal in office today. Judgment and thought content normal in office today.   Recent Results (from the past 2160 hour(s))  Urine culture     Status: None   Collection Time: 03/14/15  6:55 PM  Result Value Ref Range    Specimen Description URINE, CLEAN CATCH    Special Requests NONE    Culture MULTIPLE SPECIES PRESENT, SUGGEST RECOLLECTION    Report Status 03/16/2015 FINAL   Urinalysis complete, with microscopic     Status: Abnormal   Collection Time: 03/14/15  6:55 PM  Result Value Ref Range   Color, Urine YELLOW YELLOW   APPearance CLEAR CLEAR   Glucose, UA NEGATIVE NEGATIVE mg/dL   Bilirubin Urine NEGATIVE NEGATIVE   Ketones, ur NEGATIVE NEGATIVE mg/dL   Specific Gravity, Urine 1.010 1.005 - 1.030   Hgb urine dipstick NEGATIVE NEGATIVE   pH 5.0 5.0 - 8.0   Protein, ur NEGATIVE NEGATIVE mg/dL   Nitrite NEGATIVE NEGATIVE   Leukocytes, UA NEGATIVE NEGATIVE   RBC / HPF NONE SEEN <3 RBC/hpf   WBC, UA 0-5 <3 WBC/hpf   Bacteria, UA RARE RARE   Squamous Epithelial / LPF 0-5 (A) RARE  CBC with Differential     Status: Abnormal   Collection Time: 03/14/15  7:07 PM  Result Value Ref Range   WBC 6.0 3.6 - 11.0 K/uL   RBC 4.00 3.80 - 5.20 MIL/uL  Hemoglobin 13.7 12.0 - 16.0 g/dL   HCT 40.1 35.0 - 47.0 %   MCV 100.3 (H) 80.0 - 100.0 fL   MCH 34.3 (H) 26.0 - 34.0 pg   MCHC 34.2 32.0 - 36.0 g/dL   RDW 14.6 (H) 11.5 - 14.5 %   Platelets 132 (L) 150 - 440 K/uL   Neutrophils Relative % 33 %   Neutro Abs 2.0 1.4 - 6.5 K/uL   Lymphocytes Relative 57 %   Lymphs Abs 3.5 1.0 - 3.6 K/uL   Monocytes Relative 7 %   Monocytes Absolute 0.4 0.2 - 0.9 K/uL   Eosinophils Relative 2 %   Eosinophils Absolute 0.1 0 - 0.7 K/uL   Basophils Relative 1 %   Basophils Absolute 0.0 0 - 0.1 K/uL  Comprehensive metabolic panel     Status: Abnormal   Collection Time: 03/14/15  7:07 PM  Result Value Ref Range   Sodium 141 135 - 145 mmol/L   Potassium 3.6 3.5 - 5.1 mmol/L   Chloride 103 101 - 111 mmol/L   CO2 26 22 - 32 mmol/L   Glucose, Bld 85 65 - 99 mg/dL   BUN 7 6 - 20 mg/dL   Creatinine, Ser 0.54 0.44 - 1.00 mg/dL   Calcium 8.8 (L) 8.9 - 10.3 mg/dL   Total Protein 7.4 6.5 - 8.1 g/dL   Albumin 4.4 3.5 - 5.0 g/dL    AST 100 (H) 15 - 41 U/L   ALT 49 14 - 54 U/L   Alkaline Phosphatase 50 38 - 126 U/L   Total Bilirubin 0.7 0.3 - 1.2 mg/dL   GFR calc non Af Amer >60 >60 mL/min   GFR calc Af Amer >60 >60 mL/min    Comment: (NOTE) The eGFR has been calculated using the CKD EPI equation. This calculation has not been validated in all clinical situations. eGFR's persistently <60 mL/min signify possible Chronic Kidney Disease.    Anion gap 12 5 - 15  Amylase     Status: None   Collection Time: 03/14/15  7:07 PM  Result Value Ref Range   Amylase 99 28 - 100 U/L  Lipase, blood     Status: None   Collection Time: 03/14/15  7:07 PM  Result Value Ref Range   Lipase 30 11 - 51 U/L  CBC with Differential     Status: Abnormal   Collection Time: 03/14/15 10:29 PM  Result Value Ref Range   WBC 4.8 3.6 - 11.0 K/uL   RBC 4.10 3.80 - 5.20 MIL/uL   Hemoglobin 13.8 12.0 - 16.0 g/dL   HCT 41.7 35.0 - 47.0 %   MCV 101.8 (H) 80.0 - 100.0 fL   MCH 33.8 26.0 - 34.0 pg   MCHC 33.2 32.0 - 36.0 g/dL   RDW 14.6 (H) 11.5 - 14.5 %   Platelets 132 (L) 150 - 440 K/uL   Neutrophils Relative % 30 %   Neutro Abs 1.5 1.4 - 6.5 K/uL   Lymphocytes Relative 61 %   Lymphs Abs 2.9 1.0 - 3.6 K/uL   Monocytes Relative 6 %   Monocytes Absolute 0.3 0.2 - 0.9 K/uL   Eosinophils Relative 2 %   Eosinophils Absolute 0.1 0 - 0.7 K/uL   Basophils Relative 1 %   Basophils Absolute 0.1 0 - 0.1 K/uL  Comprehensive metabolic panel     Status: Abnormal   Collection Time: 03/14/15 10:29 PM  Result Value Ref Range   Sodium 140 135 - 145  mmol/L   Potassium 3.4 (L) 3.5 - 5.1 mmol/L   Chloride 105 101 - 111 mmol/L   CO2 28 22 - 32 mmol/L   Glucose, Bld 107 (H) 65 - 99 mg/dL   BUN 7 6 - 20 mg/dL   Creatinine, Ser 0.54 0.44 - 1.00 mg/dL   Calcium 8.7 (L) 8.9 - 10.3 mg/dL   Total Protein 7.6 6.5 - 8.1 g/dL   Albumin 4.7 3.5 - 5.0 g/dL   AST 103 (H) 15 - 41 U/L   ALT 52 14 - 54 U/L   Alkaline Phosphatase 56 38 - 126 U/L   Total  Bilirubin 0.6 0.3 - 1.2 mg/dL   GFR calc non Af Amer >60 >60 mL/min   GFR calc Af Amer >60 >60 mL/min    Comment: (NOTE) The eGFR has been calculated using the CKD EPI equation. This calculation has not been validated in all clinical situations. eGFR's persistently <60 mL/min signify possible Chronic Kidney Disease.    Anion gap 7 5 - 15  Lipase, blood     Status: None   Collection Time: 03/14/15 10:29 PM  Result Value Ref Range   Lipase 32 11 - 51 U/L  Ethanol     Status: Abnormal   Collection Time: 03/14/15 10:29 PM  Result Value Ref Range   Alcohol, Ethyl (B) 243 (H) <5 mg/dL    Comment:        LOWEST DETECTABLE LIMIT FOR SERUM ALCOHOL IS 5 mg/dL FOR MEDICAL PURPOSES ONLY   Urinalysis complete, with microscopic     Status: Abnormal   Collection Time: 03/14/15 10:32 PM  Result Value Ref Range   Color, Urine YELLOW (A) YELLOW   APPearance CLEAR (A) CLEAR   Glucose, UA NEGATIVE NEGATIVE mg/dL   Bilirubin Urine NEGATIVE NEGATIVE   Ketones, ur TRACE (A) NEGATIVE mg/dL   Specific Gravity, Urine 1.008 1.005 - 1.030   Hgb urine dipstick NEGATIVE NEGATIVE   pH 5.0 5.0 - 8.0   Protein, ur NEGATIVE NEGATIVE mg/dL   Nitrite NEGATIVE NEGATIVE   Leukocytes, UA NEGATIVE NEGATIVE   RBC / HPF 0-5 0 - 5 RBC/hpf   WBC, UA 0-5 0 - 5 WBC/hpf   Bacteria, UA NONE SEEN NONE SEEN   Squamous Epithelial / LPF 0-5 (A) NONE SEEN   Mucous PRESENT   Pregnancy, urine     Status: None   Collection Time: 03/14/15 10:32 PM  Result Value Ref Range   Preg Test, Ur NEGATIVE NEGATIVE  Pregnancy, urine POC     Status: None   Collection Time: 03/14/15 10:41 PM  Result Value Ref Range   Preg Test, Ur NEGATIVE NEGATIVE    Comment:        THE SENSITIVITY OF THIS METHODOLOGY IS >24 mIU/mL      Assessment & Plan  1. Left lower quadrant pain Etiologies discussed include ovarian vs colon. Cyclic pain without vaginal discharge or bleeding less likely to be PID. Will consult with her Gynecologist as per  her preference to discuss ovarian resection vs hormonal therapy.  - Ambulatory referral to Gynecology  2. History of ovarian cyst  - Ambulatory referral to Gynecology  3. 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  4. Alcoholic cirrhosis of liver without ascites (HCC) Reviewed lab results and ER imaging findings independently and with patient. Will need new hepatology specialist on board near future.  5. ADHD (attention deficit hyperactivity disorder), combined type Refilled for   1 month supply.   - dextroamphetamine (DEXTROSTAT) 10 MG tablet; Take 1.5 tablets (15 mg total) by mouth 2 (two) times daily.  Dispense: 90 tablet; Refill: 0   

## 2015-05-24 ENCOUNTER — Encounter: Payer: Self-pay | Admitting: Family Medicine

## 2015-05-24 ENCOUNTER — Ambulatory Visit (INDEPENDENT_AMBULATORY_CARE_PROVIDER_SITE_OTHER): Payer: Medicaid Other | Admitting: Family Medicine

## 2015-05-24 ENCOUNTER — Ambulatory Visit
Admission: RE | Admit: 2015-05-24 | Discharge: 2015-05-24 | Disposition: A | Discharge status: Home / Self Care | Payer: Medicaid Other | Source: Ambulatory Visit | Attending: Family Medicine | Admitting: Family Medicine

## 2015-05-24 VITALS — BP 114/76 | HR 122 | Temp 98.6°F | Resp 16 | Ht 64.0 in | Wt 115.9 lb

## 2015-05-24 DIAGNOSIS — M25521 Pain in right elbow: Secondary | ICD-10-CM | POA: Insufficient documentation

## 2015-05-24 DIAGNOSIS — M25511 Pain in right shoulder: Principal | ICD-10-CM

## 2015-05-24 DIAGNOSIS — M2669 Other specified disorders of temporomandibular joint: Secondary | ICD-10-CM

## 2015-05-24 DIAGNOSIS — M25561 Pain in right knee: Secondary | ICD-10-CM | POA: Diagnosis not present

## 2015-05-24 DIAGNOSIS — G8929 Other chronic pain: Secondary | ICD-10-CM | POA: Diagnosis not present

## 2015-05-24 DIAGNOSIS — M25529 Pain in unspecified elbow: Secondary | ICD-10-CM

## 2015-05-24 DIAGNOSIS — M25569 Pain in unspecified knee: Secondary | ICD-10-CM

## 2015-05-24 DIAGNOSIS — M26649 Arthritis of unspecified temporomandibular joint: Secondary | ICD-10-CM | POA: Insufficient documentation

## 2015-05-24 MED ORDER — BUPRENORPHINE 5 MCG/HR TD PTWK: 5.0000 ug | MEDICATED_PATCH | TRANSDERMAL | Status: DC

## 2015-05-24 MED ORDER — PREDNISONE 10 MG (21) PO TBPK: ORAL_TABLET | ORAL | Status: DC

## 2015-05-24 NOTE — Progress Notes (Signed)
Name: [name]   MRN: [id number]    DOB: 1980-12-19   Date:05/24/2015       Progress Note  Subjective  Chief Complaint  Chief Complaint  Patient presents with  . Shoulder Pain    right onset years and worsening.  Unknown trauma  . Ear Pain    left radiates into jaw.  onset 2 weeks    HPI  [name] is a 35 year old female here today with complaints of joint pain. Located in right shoulder pain, right elbow pain, right knee pain. Constant in the shoulder which is the worst. Onset many years ago. No trauma. Achy type of pain. Shoulder pain does keep her up at night. No fevers, chills, unexpected weight loss. Having few weeks of left ear and jaw pain. No hearing changes on headaches or visual changes. Has used ibuprofen at night for the pain with minimal relief. Otherwise does not report having recent imaging or orthopedic consultation. Many years ago she was to get a right shoulder cortisone injection but was too scared to follow through with the injection.    Past Medical History  Diagnosis Date  . Alcohol abuse   . Thyroid disease   . ADD (attention deficit disorder)   . Cirrhosis ([place])   . Depression     Patient Active Problem List   Diagnosis Date Noted  . Chronic right shoulder pain 05/24/2015  . Chronic elbow pain 05/24/2015  . Chronic knee pain 05/24/2015  . 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 Hypothyroidism (acquired) 04/05/2015  . GERD without esophagitis 04/05/2015  . Allergic rhinitis 04/05/2015  . Situational anxiety 04/05/2015  . 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 Liver cirrhosis ([place]) 04/05/2015  . Left lower quadrant pain 04/05/2015  . History of ovarian cyst 04/05/2015  . ADHD (attention deficit hyperactivity disorder), combined type 04/05/2015    Social History  Substance Use Topics  . Smoking status: Current Every Day Smoker -- 2.00 packs/day    Types: Cigarettes  . Smokeless tobacco: Not on file  . Alcohol Use:  0.0 oz/week    0 Standard drinks or equivalent per week     Comment: excessively     Current outpatient prescriptions:  .  buprenorphine (BUTRANS - DOSED MCG/HR) 5 MCG/HR PTWK patch, Place 1 patch (5 mcg total) onto the skin once a week., Disp: 4 patch, Rfl: 2 .  dextroamphetamine (DEXTROSTAT) 10 MG tablet, Take 1.5 tablets (15 mg total) by mouth 2 (two) times daily., Disp: 90 tablet, Rfl: 0 .  diclofenac sodium (VOLTAREN) 1 % GEL, Apply 2 g topically daily as needed., Disp: , Rfl:  .  fluticasone (FLONASE) 50 MCG/ACT nasal spray, Place 2 sprays into both nostrils daily., Disp: , Rfl:  .  levothyroxine (SYNTHROID, LEVOTHROID) 25 MCG tablet, Take 25 mcg by mouth daily., Disp: , Rfl:  .  LORazepam (ATIVAN) 0.5 MG tablet, Take 1 mg by mouth every 8 (eight) hours as needed., Disp: , Rfl:  .  predniSONE (STERAPRED UNI-PAK 21 TAB) 10 MG (21) TBPK tablet, Use as directed in a 6 day taper PredPak, Disp: 21 tablet, Rfl: 0 .  ranitidine (ZANTAC) 300 MG tablet, Take 300 mg by mouth every evening., Disp: , Rfl:   Past Surgical History  Procedure Laterality Date  . Appendectomy    . Cholecystectomy    . Abdominal hysterectomy      Family History  Problem Relation Age of Onset  . Cancer Father   . Hypertension  Father     Allergies  Allergen Reactions  . Penicillins Rash     Review of Systems  CONSTITUTIONAL: No significant weight changes, fever, chills, weakness or fatigue.  HEENT:  - Eyes: No visual changes.  - Ears: No auditory changes. Left ear jaw pain. - Nose: No sneezing, congestion, runny nose. - Throat: No sore throat. No changes in swallowing. SKIN: No rash or itching.  CARDIOVASCULAR: No chest pain, chest pressure or chest discomfort. No palpitations or edema.  RESPIRATORY: No shortness of breath, cough or sputum.  NEUROLOGICAL: No headache, dizziness, syncope, paralysis, ataxia, numbness or tingling in the extremities. No memory changes. No change in bowel or bladder  control.  MUSCULOSKELETAL: Yes joint pain. No muscle pain. PSYCHIATRIC: No change in mood. No change in sleep pattern.  ENDOCRINOLOGIC: No reports of sweating, cold or heat intolerance. No polyuria or polydipsia.     Objective  BP 114/76 mmHg  Pulse 122  Temp(Src) 98.6 F (37 C) (Oral)  Resp 16  Ht 5' 4" (1.626 m)  Wt 115 lb 14.4 oz (52.572 kg)  BMI 19.88 kg/m2  SpO2 98% Body mass index is 19.88 kg/(m^2).  Physical Exam  Constitutional: Patient appears well-developed and well-nourished. In no distress.  HEENT:  - Head: Normocephalic and atraumatic.  - Ears: Bilateral TMs gray, no erythema or effusion. Tenderness over left TMJ area but not temporal artery pain. - Nose: Nasal mucosa moist - Mouth/Throat: Oropharynx is clear and moist. No tonsillar hypertrophy or erythema. No post nasal drainage.  - Eyes: Conjunctivae clear, EOM movements normal. PERRLA. No scleral icterus.  Neck: Normal range of motion. Neck supple. No JVD present. No thyromegaly present.  Cardiovascular: Normal rate, regular rhythm and normal heart sounds.  No murmur heard.  Pulmonary/Chest: Effort normal and breath sounds normal. No respiratory distress. Musculoskeletal: Normal range of motion bilateral UE and LE, no joint effusions. Peripheral vascular: Bilateral LE no edema. Neurological: CN II-XII grossly intact with no focal deficits. Alert and oriented to person, place, and time. Coordination, balance, strength, speech and gait are normal.  Skin: Skin is warm and dry. No rash noted. No erythema.  Psychiatric: Patient has a anxious mood and affect. Behavior is normal in office today. Judgment and thought content normal in office today.   Recent Results (from the past 2160 hour(s))  Urine culture     Status: None   Collection Time: 03/14/15  6:55 PM  Result Value Ref Range   Specimen Description URINE, CLEAN CATCH    Special Requests NONE    Culture MULTIPLE SPECIES PRESENT, SUGGEST RECOLLECTION     Report Status 03/16/2015 FINAL   Urinalysis complete, with microscopic     Status: Abnormal   Collection Time: 03/14/15  6:55 PM  Result Value Ref Range   Color, Urine YELLOW YELLOW   APPearance CLEAR CLEAR   Glucose, UA NEGATIVE NEGATIVE mg/dL   Bilirubin Urine NEGATIVE NEGATIVE   Ketones, ur NEGATIVE NEGATIVE mg/dL   Specific Gravity, Urine 1.010 1.005 - 1.030   Hgb urine dipstick NEGATIVE NEGATIVE   pH 5.0 5.0 - 8.0   Protein, ur NEGATIVE NEGATIVE mg/dL   Nitrite NEGATIVE NEGATIVE   Leukocytes, UA NEGATIVE NEGATIVE   RBC / HPF NONE SEEN <3 RBC/hpf   WBC, UA 0-5 <3 WBC/hpf   Bacteria, UA RARE RARE   Squamous Epithelial / LPF 0-5 (A) RARE  CBC with Differential     Status: Abnormal   Collection Time: 03/14/15  7:07 PM  Result Value  Ref Range   WBC 6.0 3.6 - 11.0 K/uL   RBC 4.00 3.80 - 5.20 MIL/uL   Hemoglobin 13.7 12.0 - 16.0 g/dL   HCT 40.1 35.0 - 47.0 %   MCV 100.3 (H) 80.0 - 100.0 fL   MCH 34.3 (H) 26.0 - 34.0 pg   MCHC 34.2 32.0 - 36.0 g/dL   RDW 14.6 (H) 11.5 - 14.5 %   Platelets 132 (L) 150 - 440 K/uL   Neutrophils Relative % 33 %   Neutro Abs 2.0 1.4 - 6.5 K/uL   Lymphocytes Relative 57 %   Lymphs Abs 3.5 1.0 - 3.6 K/uL   Monocytes Relative 7 %   Monocytes Absolute 0.4 0.2 - 0.9 K/uL   Eosinophils Relative 2 %   Eosinophils Absolute 0.1 0 - 0.7 K/uL   Basophils Relative 1 %   Basophils Absolute 0.0 0 - 0.1 K/uL  Comprehensive metabolic panel     Status: Abnormal   Collection Time: 03/14/15  7:07 PM  Result Value Ref Range   Sodium 141 135 - 145 mmol/L   Potassium 3.6 3.5 - 5.1 mmol/L   Chloride 103 101 - 111 mmol/L   CO2 26 22 - 32 mmol/L   Glucose, Bld 85 65 - 99 mg/dL   BUN 7 6 - 20 mg/dL   Creatinine, Ser 0.54 0.44 - 1.00 mg/dL   Calcium 8.8 (L) 8.9 - 10.3 mg/dL   Total Protein 7.4 6.5 - 8.1 g/dL   Albumin 4.4 3.5 - 5.0 g/dL   AST 100 (H) 15 - 41 U/L   ALT 49 14 - 54 U/L   Alkaline Phosphatase 50 38 - 126 U/L   Total Bilirubin 0.7 0.3 - 1.2 mg/dL    GFR calc non Af Amer >60 >60 mL/min   GFR calc Af Amer >60 >60 mL/min    Comment: (NOTE) The eGFR has been calculated using the CKD EPI equation. This calculation has not been validated in all clinical situations. eGFR's persistently <60 mL/min signify possible Chronic Kidney Disease.    Anion gap 12 5 - 15  Amylase     Status: None   Collection Time: 03/14/15  7:07 PM  Result Value Ref Range   Amylase 99 28 - 100 U/L  Lipase, blood     Status: None   Collection Time: 03/14/15  7:07 PM  Result Value Ref Range   Lipase 30 11 - 51 U/L  CBC with Differential     Status: Abnormal   Collection Time: 03/14/15 10:29 PM  Result Value Ref Range   WBC 4.8 3.6 - 11.0 K/uL   RBC 4.10 3.80 - 5.20 MIL/uL   Hemoglobin 13.8 12.0 - 16.0 g/dL   HCT 41.7 35.0 - 47.0 %   MCV 101.8 (H) 80.0 - 100.0 fL   MCH 33.8 26.0 - 34.0 pg   MCHC 33.2 32.0 - 36.0 g/dL   RDW 14.6 (H) 11.5 - 14.5 %   Platelets 132 (L) 150 - 440 K/uL   Neutrophils Relative % 30 %   Neutro Abs 1.5 1.4 - 6.5 K/uL   Lymphocytes Relative 61 %   Lymphs Abs 2.9 1.0 - 3.6 K/uL   Monocytes Relative 6 %   Monocytes Absolute 0.3 0.2 - 0.9 K/uL   Eosinophils Relative 2 %   Eosinophils Absolute 0.1 0 - 0.7 K/uL   Basophils Relative 1 %   Basophils Absolute 0.1 0 - 0.1 K/uL  Comprehensive metabolic panel     Status:  Abnormal   Collection Time: 03/14/15 10:29 PM  Result Value Ref Range   Sodium 140 135 - 145 mmol/L   Potassium 3.4 (L) 3.5 - 5.1 mmol/L   Chloride 105 101 - 111 mmol/L   CO2 28 22 - 32 mmol/L   Glucose, Bld 107 (H) 65 - 99 mg/dL   BUN 7 6 - 20 mg/dL   Creatinine, Ser 0.54 0.44 - 1.00 mg/dL   Calcium 8.7 (L) 8.9 - 10.3 mg/dL   Total Protein 7.6 6.5 - 8.1 g/dL   Albumin 4.7 3.5 - 5.0 g/dL   AST 103 (H) 15 - 41 U/L   ALT 52 14 - 54 U/L   Alkaline Phosphatase 56 38 - 126 U/L   Total Bilirubin 0.6 0.3 - 1.2 mg/dL   GFR calc non Af Amer >60 >60 mL/min   GFR calc Af Amer >60 >60 mL/min    Comment: (NOTE) The eGFR has  been calculated using the CKD EPI equation. This calculation has not been validated in all clinical situations. eGFR's persistently <60 mL/min signify possible Chronic Kidney Disease.    Anion gap 7 5 - 15  Lipase, blood     Status: None   Collection Time: 03/14/15 10:29 PM  Result Value Ref Range   Lipase 32 11 - 51 U/L  Ethanol     Status: Abnormal   Collection Time: 03/14/15 10:29 PM  Result Value Ref Range   Alcohol, Ethyl (B) 243 (H) <5 mg/dL    Comment:        LOWEST DETECTABLE LIMIT FOR SERUM ALCOHOL IS 5 mg/dL FOR MEDICAL PURPOSES ONLY   Urinalysis complete, with microscopic     Status: Abnormal   Collection Time: 03/14/15 10:32 PM  Result Value Ref Range   Color, Urine YELLOW (A) YELLOW   APPearance CLEAR (A) CLEAR   Glucose, UA NEGATIVE NEGATIVE mg/dL   Bilirubin Urine NEGATIVE NEGATIVE   Ketones, ur TRACE (A) NEGATIVE mg/dL   Specific Gravity, Urine 1.008 1.005 - 1.030   Hgb urine dipstick NEGATIVE NEGATIVE   pH 5.0 5.0 - 8.0   Protein, ur NEGATIVE NEGATIVE mg/dL   Nitrite NEGATIVE NEGATIVE   Leukocytes, UA NEGATIVE NEGATIVE   RBC / HPF 0-5 0 - 5 RBC/hpf   WBC, UA 0-5 0 - 5 WBC/hpf   Bacteria, UA NONE SEEN NONE SEEN   Squamous Epithelial / LPF 0-5 (A) NONE SEEN   Mucous PRESENT   Pregnancy, urine     Status: None   Collection Time: 03/14/15 10:32 PM  Result Value Ref Range   Preg Test, Ur NEGATIVE NEGATIVE  Pregnancy, urine POC     Status: None   Collection Time: 03/14/15 10:41 PM  Result Value Ref Range   Preg Test, Ur NEGATIVE NEGATIVE    Comment:        THE SENSITIVITY OF THIS METHODOLOGY IS >24 mIU/mL      Assessment & Plan   1. Chronic right shoulder pain [place] patch for pain management, prednisone for inflammation, avoid NSAID use due to possible liver cirrhosis. Will start with x-ray imaging and rule out rheumatological disorder.   - DG Shoulder Right; Future - ANA - Cyclic citrul peptide antibody, IgG - Sedimentation rate -  Rheumatoid factor - buprenorphine (BUTRANS - DOSED MCG/HR) 5 MCG/HR PTWK patch; Place 1 patch (5 mcg total) onto the skin once a week.  Dispense: 4 patch; Refill: 2 - predniSONE (STERAPRED UNI-PAK 21 TAB) 10 MG (21) TBPK tablet; Use as  directed in a 6 day taper PredPak  Dispense: 21 tablet; Refill: 0  2. Chronic elbow pain, right  - DG Elbow Complete Right; Future - ANA - Cyclic citrul peptide antibody, IgG - Sedimentation rate - Rheumatoid factor - buprenorphine (BUTRANS - DOSED MCG/HR) 5 MCG/HR PTWK patch; Place 1 patch (5 mcg total) onto the skin once a week.  Dispense: 4 patch; Refill: 2 - predniSONE (STERAPRED UNI-PAK 21 TAB) 10 MG (21) TBPK tablet; Use as directed in a 6 day taper PredPak  Dispense: 21 tablet; Refill: 0  3. Chronic knee pain, right  - DG Knee Complete 4 Views Right; Future - ANA - Cyclic citrul peptide antibody, IgG - Sedimentation rate - Rheumatoid factor - buprenorphine (BUTRANS - DOSED MCG/HR) 5 MCG/HR PTWK patch; Place 1 patch (5 mcg total) onto the skin once a week.  Dispense: 4 patch; Refill: 2 - predniSONE (STERAPRED UNI-PAK 21 TAB) 10 MG (21) TBPK tablet; Use as directed in a 6 day taper PredPak  Dispense: 21 tablet; Refill: 0  4. TMJ arthritis  - ANA - Cyclic citrul peptide antibody, IgG - Sedimentation rate - Rheumatoid factor - buprenorphine (BUTRANS - DOSED MCG/HR) 5 MCG/HR PTWK patch; Place 1 patch (5 mcg total) onto the skin once a week.  Dispense: 4 patch; Refill: 2 - predniSONE (STERAPRED UNI-PAK 21 TAB) 10 MG (21) TBPK tablet; Use as directed in a 6 day taper PredPak  Dispense: 21 tablet; Refill: 0

## 2015-05-26 ENCOUNTER — Other Ambulatory Visit: Payer: Self-pay | Admitting: Family Medicine

## 2015-05-26 DIAGNOSIS — M25511 Pain in right shoulder: Principal | ICD-10-CM

## 2015-05-26 DIAGNOSIS — R768 Other specified abnormal immunological findings in serum: Secondary | ICD-10-CM

## 2015-05-26 DIAGNOSIS — M25561 Pain in right knee: Secondary | ICD-10-CM

## 2015-05-26 DIAGNOSIS — M25521 Pain in right elbow: Secondary | ICD-10-CM

## 2015-05-26 DIAGNOSIS — G8929 Other chronic pain: Secondary | ICD-10-CM

## 2015-05-26 LAB — ANA: Anti Nuclear Antibody(ANA): POSITIVE — AB

## 2015-05-26 LAB — CYCLIC CITRUL PEPTIDE ANTIBODY, IGG/IGA: CYCLIC CITRULLIN PEPTIDE AB: 4 U (ref 0–19)

## 2015-05-26 LAB — RHEUMATOID FACTOR: RHEUMATOID FACTOR: 13 [IU]/mL (ref 0.0–13.9)

## 2015-05-26 LAB — SEDIMENTATION RATE: Sed Rate: 2 mm/hr (ref 0–32)

## 2015-05-26 NOTE — Progress Notes (Signed)
Referral placed.

## 2015-06-02 ENCOUNTER — Other Ambulatory Visit: Payer: Self-pay

## 2015-06-02 DIAGNOSIS — F902 Attention-deficit hyperactivity disorder, combined type: Secondary | ICD-10-CM

## 2015-06-02 MED ORDER — DEXTROAMPHETAMINE SULFATE 15 MG PO TABS: 15.0000 mg | ORAL_TABLET | Freq: Two times a day (BID) | ORAL | Status: DC

## 2015-06-05 DIAGNOSIS — D7589 Other specified diseases of blood and blood-forming organs: Secondary | ICD-10-CM | POA: Insufficient documentation

## 2015-06-05 DIAGNOSIS — I7300 Raynaud's syndrome without gangrene: Secondary | ICD-10-CM | POA: Insufficient documentation

## 2015-06-05 DIAGNOSIS — M2550 Pain in unspecified joint: Secondary | ICD-10-CM | POA: Insufficient documentation

## 2015-06-05 DIAGNOSIS — R231 Pallor: Secondary | ICD-10-CM | POA: Insufficient documentation

## 2015-06-05 DIAGNOSIS — R768 Other specified abnormal immunological findings in serum: Secondary | ICD-10-CM | POA: Insufficient documentation

## 2015-07-05 ENCOUNTER — Inpatient Hospital Stay: Payer: Medicaid Other

## 2015-07-05 ENCOUNTER — Encounter: Payer: Self-pay | Admitting: Hematology and Oncology

## 2015-07-05 ENCOUNTER — Inpatient Hospital Stay: Payer: Medicaid Other | Attending: Hematology and Oncology | Admitting: Hematology and Oncology

## 2015-07-05 VITALS — BP 126/84 | HR 114 | Temp 98.0°F | Resp 18 | Ht 64.0 in | Wt 115.3 lb

## 2015-07-05 DIAGNOSIS — F1010 Alcohol abuse, uncomplicated: Secondary | ICD-10-CM | POA: Insufficient documentation

## 2015-07-05 DIAGNOSIS — F329 Major depressive disorder, single episode, unspecified: Secondary | ICD-10-CM | POA: Diagnosis not present

## 2015-07-05 DIAGNOSIS — R040 Epistaxis: Secondary | ICD-10-CM

## 2015-07-05 DIAGNOSIS — E079 Disorder of thyroid, unspecified: Secondary | ICD-10-CM | POA: Insufficient documentation

## 2015-07-05 DIAGNOSIS — D696 Thrombocytopenia, unspecified: Secondary | ICD-10-CM | POA: Diagnosis not present

## 2015-07-05 DIAGNOSIS — K7030 Alcoholic cirrhosis of liver without ascites: Secondary | ICD-10-CM | POA: Insufficient documentation

## 2015-07-05 DIAGNOSIS — Z79899 Other long term (current) drug therapy: Secondary | ICD-10-CM | POA: Diagnosis not present

## 2015-07-05 DIAGNOSIS — F17210 Nicotine dependence, cigarettes, uncomplicated: Secondary | ICD-10-CM | POA: Diagnosis not present

## 2015-07-05 DIAGNOSIS — D693 Immune thrombocytopenic purpura: Secondary | ICD-10-CM | POA: Insufficient documentation

## 2015-07-05 DIAGNOSIS — E538 Deficiency of other specified B group vitamins: Secondary | ICD-10-CM | POA: Diagnosis not present

## 2015-07-05 DIAGNOSIS — M2550 Pain in unspecified joint: Secondary | ICD-10-CM | POA: Diagnosis not present

## 2015-07-05 DIAGNOSIS — K7460 Unspecified cirrhosis of liver: Secondary | ICD-10-CM

## 2015-07-05 LAB — TSH: TSH: 1.219 u[IU]/mL (ref 0.350–4.500)

## 2015-07-05 LAB — FOLATE: Folate: 3.7 ng/mL — ABNORMAL LOW (ref >5.9)

## 2015-07-05 LAB — BASIC METABOLIC PANEL
Anion gap: 9 (ref 5–15)
BUN: 6 mg/dL (ref 6–20)
CO2: 24 mmol/L (ref 22–32)
Calcium: 8.4 mg/dL — ABNORMAL LOW (ref 8.9–10.3)
Chloride: 103 mmol/L (ref 101–111)
Creatinine, Ser: 0.44 mg/dL (ref 0.44–1.00)
GFR calc Af Amer: >60 mL/min (ref >60)
GFR calc non Af Amer: >60 mL/min (ref >60)
Glucose, Bld: 75 mg/dL (ref 65–99)
Potassium: 3.6 mmol/L (ref 3.5–5.1)
Sodium: 136 mmol/L (ref 135–145)

## 2015-07-05 LAB — T4, FREE: Free T4: 0.62 ng/dL (ref 0.61–1.12)

## 2015-07-05 LAB — APTT: aPTT: 28 seconds (ref 24–36)

## 2015-07-05 LAB — PROTIME-INR
INR: 0.98
Prothrombin Time: 13.2 seconds (ref 11.4–15.0)

## 2015-07-06 ENCOUNTER — Inpatient Hospital Stay: Payer: Medicaid Other

## 2015-07-06 DIAGNOSIS — D696 Thrombocytopenia, unspecified: Secondary | ICD-10-CM | POA: Diagnosis not present

## 2015-07-06 LAB — CBC WITH DIFFERENTIAL/PLATELET
Basophils Absolute: 0.1 10*3/uL (ref 0–0.1)
Basophils Relative: 1 %
Eosinophils Absolute: 0.1 10*3/uL (ref 0–0.7)
Eosinophils Relative: 2 %
HCT: 39.3 % (ref 35.0–47.0)
Hemoglobin: 13.3 g/dL (ref 12.0–16.0)
Lymphocytes Relative: 48 %
Lymphs Abs: 2.1 10*3/uL (ref 1.0–3.6)
MCH: 34.8 pg — ABNORMAL HIGH (ref 26.0–34.0)
MCHC: 33.8 g/dL (ref 32.0–36.0)
MCV: 103.1 fL — ABNORMAL HIGH (ref 80.0–100.0)
Monocytes Absolute: 0.4 10*3/uL (ref 0.2–0.9)
Monocytes Relative: 8 %
Neutro Abs: 1.9 10*3/uL (ref 1.4–6.5)
Neutrophils Relative %: 41 %
Platelets: 114 10*3/uL — ABNORMAL LOW (ref 150–440)
RBC: 3.81 MIL/uL (ref 3.80–5.20)
RDW: 13.4 % (ref 11.5–14.5)
WBC: 4.5 10*3/uL (ref 3.6–11.0)

## 2015-07-06 LAB — VITAMIN B12: Vitamin B-12: 352 pg/mL (ref 180–914)

## 2015-07-06 LAB — AFP TUMOR MARKER: AFP-Tumor Marker: 4.4 ng/mL (ref 0.0–8.3)

## 2015-07-10 ENCOUNTER — Other Ambulatory Visit: Payer: Self-pay

## 2015-07-10 DIAGNOSIS — F902 Attention-deficit hyperactivity disorder, combined type: Secondary | ICD-10-CM

## 2015-07-10 NOTE — Telephone Encounter (Signed)
Patient is requesting refill on medication, but ask for the 10 mg instead of the 15 mg. States she like taking the 10 mg 1/2 in the am and 1/2 in the evening better.

## 2015-07-11 ENCOUNTER — Other Ambulatory Visit: Payer: Self-pay | Admitting: Family Medicine

## 2015-07-11 DIAGNOSIS — F902 Attention-deficit hyperactivity disorder, combined type: Secondary | ICD-10-CM

## 2015-07-11 MED ORDER — DEXTROAMPHETAMINE SULFATE 10 MG PO TABS: 5.0000 mg | ORAL_TABLET | Freq: Two times a day (BID) | ORAL | Status: DC

## 2015-07-11 NOTE — Progress Notes (Signed)
Patient called and requested reduced dose of ADHD med from 15 mg to 10mg  take 1/2 tab po bid.

## 2015-07-12 ENCOUNTER — Inpatient Hospital Stay (HOSPITAL_BASED_OUTPATIENT_CLINIC_OR_DEPARTMENT_OTHER): Payer: Medicaid Other | Admitting: Hematology and Oncology

## 2015-07-12 ENCOUNTER — Inpatient Hospital Stay: Payer: Medicaid Other

## 2015-07-12 ENCOUNTER — Other Ambulatory Visit: Payer: Self-pay | Admitting: Family Medicine

## 2015-07-12 VITALS — BP 105/76 | HR 111 | Temp 97.0°F | Resp 18 | Ht 64.0 in | Wt 113.2 lb

## 2015-07-12 DIAGNOSIS — F902 Attention-deficit hyperactivity disorder, combined type: Secondary | ICD-10-CM

## 2015-07-12 DIAGNOSIS — E079 Disorder of thyroid, unspecified: Secondary | ICD-10-CM

## 2015-07-12 DIAGNOSIS — F17210 Nicotine dependence, cigarettes, uncomplicated: Secondary | ICD-10-CM

## 2015-07-12 DIAGNOSIS — Z79899 Other long term (current) drug therapy: Secondary | ICD-10-CM

## 2015-07-12 DIAGNOSIS — D696 Thrombocytopenia, unspecified: Secondary | ICD-10-CM

## 2015-07-12 DIAGNOSIS — D693 Immune thrombocytopenic purpura: Secondary | ICD-10-CM | POA: Diagnosis not present

## 2015-07-12 DIAGNOSIS — M2550 Pain in unspecified joint: Secondary | ICD-10-CM

## 2015-07-12 DIAGNOSIS — F329 Major depressive disorder, single episode, unspecified: Secondary | ICD-10-CM

## 2015-07-12 DIAGNOSIS — R040 Epistaxis: Secondary | ICD-10-CM

## 2015-07-12 DIAGNOSIS — D7589 Other specified diseases of blood and blood-forming organs: Secondary | ICD-10-CM

## 2015-07-12 DIAGNOSIS — E538 Deficiency of other specified B group vitamins: Secondary | ICD-10-CM | POA: Diagnosis not present

## 2015-07-12 DIAGNOSIS — K7030 Alcoholic cirrhosis of liver without ascites: Secondary | ICD-10-CM

## 2015-07-12 DIAGNOSIS — F1010 Alcohol abuse, uncomplicated: Secondary | ICD-10-CM

## 2015-07-12 MED ORDER — FOLIC ACID 1 MG PO TABS: 1.0000 mg | ORAL_TABLET | Freq: Every day | ORAL | Status: DC

## 2015-07-12 MED ORDER — DEXTROAMPHETAMINE SULFATE 10 MG PO TABS: 5.0000 mg | ORAL_TABLET | Freq: Two times a day (BID) | ORAL | Status: DC

## 2015-07-12 NOTE — Progress Notes (Signed)
[facility] day:  07/05/2015  Chief Complaint: Dia Donate is a 35 y.o. female with thrombocytopenia who is referred in consultation by Dr. Marlowe Sax.  HPI:  The patient notes a history of thrombocytopenia dating back approximately 16 years ago.  She was told that her platelets were low when her daughter was 27 months old (she is now 26 years old).  She is unaware of her platelet count.  She denied any excess bruising or bleeding.  No intervention was required.    She states that every time blood work is done, she is told that her platelets are low.  Available labs not a platelet count of 173,000 on 08/05/2012, 106,000 on 08/15/2012, 200,000 on 08/19/2012, and 132,000 on 03/14/2015.  Labs on 06/05/2015 revealed a hematocrit of 37.5, hemoglobin 12.8, MCV 102.2, platelets 113,000, WBC 4400 with an Gage of 2040.  Differential was unremarkable.  IgG was 943, IgA 239, and IgM 58.  SPEP revealed no monoclonal protein.  Hepatitis B surface antigen, hepatitis Be antigen, hepatitis B core IgM, hepatitis B e antibody, hepatitis B core antibody total, hepatitis B surface antibody, and hepatitis C antibody were negative.  C-ANCA and P-ANCA were < 1:20.  Beta2-glocoprotein antibodies were < 9.  ANA was negative.  Anti-cardiolipin antibodies were negative.  Lupus anticoagulant testing was negative.  C3 and C4 were normal.  LDH was 229 (34-200)  Abdominal ultrasound on 03/15/2015 revealed increased and patchy hepatic echogenicity which may reflect steatosis or chronic liver disease.  She notes a history of liver cirrhosis for 4-5 years.  She was previoulsy seen by a hepatologist.  She has no history of splenomegaly.  She states that she began drinking a significant amount of alcohol around the age of 52.  She has quit a number of times.  She will drink a pint a day.  She sometimes drinks more and sometimes less.  She notes issues with her platelets even when  she is not drinking.  Regarding her diet, she states that it is good.  She eats vegetables.  She craves chicken.  She is "not big on fruit'.  She was seen by rheumatology in consultation on 06/05/2015 for a positive ANA and multiple arthralgias.  Labs are as noted above.  She states that she has bruised easily for years.  She has an occasional nose bleed.  Her gums have bled recently.  She had a hysterectomy and notes "having a hard time for the wound to close".  She does not take aspirin.  She rarely takes ibuprofen for headaches or her shoulder.  She denies any herbal products or new medications.  She does not drink quinine water. She has never required a transfusion.  She has no family history of any blood disorders.  Past Medical History  Diagnosis Date  . Alcohol abuse   . Thyroid disease   . ADD (attention deficit disorder)   . Cirrhosis ([place])   . Depression     Past Surgical History  Procedure Laterality Date  . Appendectomy    . Cholecystectomy    . Abdominal hysterectomy      Family History  Problem Relation Age of Onset  . Cancer Father   . Hypertension Father     Social History:  reports that she has been smoking Cigarettes.  She has been smoking about 2.00 packs per day. She does not have any smokeless tobacco history on file. She reports that she drinks alcohol. She reports  that she does not use illicit drugs.  She has 2 children (age 72 and 82).  The patient has lived in [place] then [place] and is now back in [place].  The patient is alone today.  Allergies:  Allergies  Allergen Reactions  . Penicillins Rash    Current Medications: Current Outpatient Prescriptions  Medication Sig Dispense Refill  . diclofenac sodium (VOLTAREN) 1 % GEL Apply 2 g topically daily as needed.    . fluticasone (FLONASE) 50 MCG/ACT nasal spray Place 2 sprays into both nostrils daily.    Marland Kitchen levothyroxine (SYNTHROID, LEVOTHROID) 25 MCG tablet Take 25 mcg by mouth daily.    Marland Kitchen LORazepam  (ATIVAN) 0.5 MG tablet Take 1 mg by mouth every 8 (eight) hours as needed.    . ranitidine (ZANTAC) 300 MG tablet Take 300 mg by mouth every evening.    Marland Kitchen dextroamphetamine (DEXTROSTAT) 10 MG tablet Take 0.5 tablets (5 mg total) by mouth 2 (two) times daily. 30 tablet 0   No current facility-administered medications for this visit.    Review of Systems:  GENERAL:  Feels good.  Active.  No fevers, sweats or weight loss. PERFORMANCE STATUS (ECOG):  1 HEENT:  No visual changes, runny nose, sore throat, mouth sores or tenderness. Lungs: Mild shortness of breath.  Dry cough.  No hemoptysis. Cardiac:  No chest pain, palpitations, orthopnea, or PND. GI:  Occasional nausea.  Off and on constipation and diarrhea.  No nausea, vomiting, diarrhea, constipation, melena or hematochezia. GU:  No urgency, frequency, dysuria, or hematuria. Musculoskeletal:  Shoulder and elbow pain.  No back pain.  No muscle tenderness. Extremities:  No pain or swelling. Skin:  Thin hair.  No rashes or skin changes. Neuro:  No headache, numbness or weakness, balance or coordination issues. Endocrine:  No diabetes, thyroid issues, hot flashes or night sweats. Psych:  No mood changes, depression or anxiety. Pain:  No focal pain. Review of systems:  All other systems reviewed and found to be negative.  Physical Exam: Blood pressure 126/84, pulse 114, temperature 98 F (36.7 C), temperature source Tympanic, resp. rate 18, height _0  (1.626 m), weight 115 lb 4.8 oz (52.3 kg). GENERAL:  Well developed, well nourished, sitting comfortably in the exam room in no acute distress. MENTAL STATUS:  Alert and oriented to person, place and time. HEAD:  Blonde hair pulled back.  Facial erythema.  Normocephalic, atraumatic, face symmetric, no Cushingoid features. EYES:  Blue eyes.  Pupils equal round and reactive to light and accomodation.  No conjunctivitis or scleral icterus. ENT:  Oropharynx clear without lesion.  Upper dentures.   Tongue normal. Mucous membranes moist. Left nares pierced. RESPIRATORY:  Clear to auscultation without rales, wheezes or rhonchi. CARDIOVASCULAR:  Regular rate and rhythm without murmur, rub or gallop. ABDOMEN:  Soft, non-tender, with active bowel sounds, and no hepatomegaly.  Spleen tip palpable.  No masses. SKIN:  Tattoos on arms.  No rashes, ulcers or lesions. EXTREMITIES: No edema, no skin discoloration or tenderness.  No palpable cords. LYMPH NODES: No palpable cervical, supraclavicular, axillary or inguinal adenopathy  NEUROLOGICAL: Unremarkable. PSYCH:  Appropriate.  Office Visit on 07/05/2015  Component Date Value Ref Range Status  . Sodium 07/05/2015 136  135 - 145 mmol/L Final  . Potassium 07/05/2015 3.6  3.5 - 5.1 mmol/L Final  . Chloride 07/05/2015 103  101 - 111 mmol/L Final  . CO2 07/05/2015 24  22 - 32 mmol/L Final  . Glucose, Bld 07/05/2015 75  65 -  99 mg/dL Final  . BUN 07/05/2015 6  6 - 20 mg/dL Final  . Creatinine, Ser 07/05/2015 0.44  0.44 - 1.00 mg/dL Final  . Calcium 07/05/2015 8.4* 8.9 - 10.3 mg/dL Final  . GFR calc non Af Amer 07/05/2015 >60  >60 mL/min Final  . GFR calc Af Amer 07/05/2015 >60  >60 mL/min Final   Comment: (NOTE) The eGFR has been calculated using the CKD EPI equation. This calculation has not been validated in all clinical situations. eGFR's persistently <60 mL/min signify possible Chronic Kidney Disease.   . Anion gap 07/05/2015 9  5 - 15 Final  . AFP-Tumor Marker 07/05/2015 4.4  0.0 - 8.3 ng/mL Final   Comment: (NOTE) Roche ECLIA methodology Performed At: [facility] [place], [place] [id number] Lindon Romp MD [place]:[id number]   . Prothrombin Time 07/05/2015 13.2  11.4 - 15.0 seconds Final  . INR 07/05/2015 0.98   Final  . aPTT 07/05/2015 28  24 - 36 seconds Final  . Vitamin B-12 07/05/2015 352  180 - 914 pg/mL Final   Comment: (NOTE) This assay is not validated for testing neonatal or myeloproliferative  syndrome specimens for Vitamin B12 levels. Performed at [facility]   . Folate 07/05/2015 3.7* >5.9 ng/mL Final  Appointment on 07/05/2015  Component Date Value Ref Range Status  . Free T4 07/05/2015 0.62  0.61 - 1.12 ng/dL Final  . TSH 07/05/2015 1.219  0.350 - 4.500 uIU/mL Final    Assessment:  Echo Propp is a 35 y.o. female with a history of intermittent thrombocytopenia for 16 years.  Platelet count has ranged between 106,000 - 113,00.  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  She has drank alcohol (a pint a day) for the past 6 years.  She has a  history of liver cirrhosis for 4-5 years.  She has no history of splenomegaly.  Diet is modest.  She denies any new medications or herbal products.  Work-up on 06/05/2015 revealed the following normal labs:  SPEP, immunoglobulins (IgG, IgA, and IgM), hepatitis B testing, hepatitis C testing,  C-ANCA,  P-ANCA, beta2-glocoprotein antibodies, ANA, anti-cardiolipin antibodies, lupus anticoagulant testing, C3, C4 , and LDH.  Symptomatically, she has arthralgias.  Exam reveals a palpable spleen tip.  Plan: 1. Review differential diagnosis of thrombocytopenia.  Suspect etiology is due to alcohol use and associated cirrhosis and possible mild splenomegaly.  Discuss possible pseudothrombocytopenia.  Discuss possible ITP given mild intermittent thrombocytopenia.  Discuss initial plan for labs. 2. Labs today:  CBC with diff (in blue top tube), BMP, PT, PTT, B12, folate, TSH, free T4, AFP. 3. Contact radiology regarding 03/15/2015 ultrasound and if spleen size is available. 4. Encourage discontinuation of alcohol. 5. RTC in 1 week for MD assessment and review of work-up.    Lequita Asal, MD  07/05/2015

## 2015-07-12 NOTE — Progress Notes (Signed)
No changes since last visit. 

## 2015-07-12 NOTE — Progress Notes (Signed)
[facility]-  Cancer Center  Clinic day:  07/12/2015   Chief Complaint: Cindy InaShanna Pineda is a 35 y.o. female with thrombocytopenia who is seen for review of work-up and discussion regarding direction of therapy.  HPI:  The patient was last seen in the medical oncology clinic on 07/05/2015.  At that time, she was seen for initial consultation regarding thrombocytopenia.  She noted thrombocytopenia for at least 16 years.  Labs revealed intermittent mild thrombocytopenia.  She notes a history of alcohol abuse (pint a day) for 6 years.  Symptomatically, she had arthralgias. Exam revealed a palpable spleen tip.  She underwent a work-up.  Labs revealed a hematocrit of 39.3, hemoglobin 13.3, MCV 103.1, platelets 114,000, white count 4500 with an ANC of 1900. Platelet count in a blue top tube was 96,000.  Basic metabolic panel included a creatinine of 0.44.  AFP was 4.4. PT was 13.2 with an INR of 0.98.  PTT was 28.  Vitamin B12 was 352.  Folate was 3.7 (greater than 5.9).  TSH was 1.219 with a free T4 of 0.62.  Symptomatically, she notes no change from last week. She denies any bruising or bleeding.  She notes that she used to do used to take B12 at home (injections). She is drinking less alcohol.  Past Medical History  Diagnosis Date  . Alcohol abuse   . Thyroid disease   . ADD (attention deficit disorder)   . Cirrhosis (HCC)   . Depression     Past Surgical History  Procedure Laterality Date  . Appendectomy    . Cholecystectomy    . Abdominal hysterectomy      Family History  Problem Relation Age of Onset  . Cancer Father   . Hypertension Father     Social History:  reports that she has been smoking Cigarettes.  She has been smoking about 2.00 packs per day. She does not have any smokeless tobacco history on file. She reports that she drinks alcohol. She reports that she does not use illicit drugs.  She has 2 children (age 37 and 8116).  The patient has lived in [place]  then [place] and is now back in [place].  The patient is alone today.  Allergies:  Allergies  Allergen Reactions  . Penicillins Rash    Current Medications: Current Outpatient Prescriptions  Medication Sig Dispense Refill  . dextroamphetamine (DEXTROSTAT) 10 MG tablet Take 0.5 tablets (5 mg total) by mouth 2 (two) times daily. 30 tablet 0  . diclofenac sodium (VOLTAREN) 1 % GEL Apply 2 g topically daily as needed.    . fluticasone (FLONASE) 50 MCG/ACT nasal spray Place 2 sprays into both nostrils daily.    Marland Kitchen. levothyroxine (SYNTHROID, LEVOTHROID) 25 MCG tablet Take 25 mcg by mouth daily.    Marland Kitchen. LORazepam (ATIVAN) 0.5 MG tablet Take 1 mg by mouth every 8 (eight) hours as needed.    . ranitidine (ZANTAC) 300 MG tablet Take 300 mg by mouth every evening.     No current facility-administered medications for this visit.    Review of Systems:  GENERAL:  Feels "ok".  No fevers, sweats or weight loss. PERFORMANCE STATUS (ECOG):  1 HEENT:  No visual changes, runny nose, sore throat, mouth sores or tenderness. Lungs:  Mild chronic shortness of breath.  Dry cough.  No hemoptysis. Cardiac:  No chest pain, palpitations, orthopnea, or PND. GI:  Occasional nausea.  Off and on constipation and diarrhea.  No vomiting, melena or hematochezia. GU:  No  urgency, frequency, dysuria, or hematuria. Musculoskeletal:  Shoulder and elbow pain.  No back pain.  No muscle tenderness. Extremities:  Hair thin.  No pain or swelling. Skin:  No rashes or skin changes. Neuro:  No headache, numbness or weakness, balance or coordination issues. Endocrine:  Thyroid disease on Synthroid.  No diabetes, hot flashes or night sweats. Psych:  No mood changes, depression or anxiety. Pain:  No focal pain. Review of systems:  All other systems reviewed and found to be negative.  Physical Exam: Blood pressure 105/76, pulse 111, temperature 97 F (36.1 C), temperature source Tympanic, resp. rate 18, height  (1.626 m), weight  113 lb 3.3 oz (51.35 kg). GENERAL:  Well developed, well nourished, sitting comfortably in the exam room in no acute distress. MENTAL STATUS:  Alert and oriented to person, place and time. HEAD:  Blonde hair pulled back.  Facial erythema.  Normocephalic, atraumatic, face symmetric, no Cushingoid features. EYES:  Blue eyes.  No conjunctivitis or scleral icterus. NEUROLOGICAL: Unremarkable. PSYCH:  Appropriate.  No visits with results within 3 Day(s) from this visit. Latest known visit with results is:  Appointment on 07/06/2015  Component Date Value Ref Range Status  . WBC 07/06/2015 4.5  3.6 - 11.0 K/uL Final  . RBC 07/06/2015 3.81  3.80 - 5.20 MIL/uL Final  . Hemoglobin 07/06/2015 13.3  12.0 - 16.0 g/dL Final  . HCT 16/02/9603 39.3  35.0 - 47.0 % Final  . MCV 07/06/2015 103.1* 80.0 - 100.0 fL Final  . MCH 07/06/2015 34.8* 26.0 - 34.0 pg Final  . MCHC 07/06/2015 33.8  32.0 - 36.0 g/dL Final  . RDW 54/01/8118 13.4  11.5 - 14.5 % Final  . Platelets 07/06/2015 114* 150 - 440 K/uL Final   CITRATE TUBE PLATELET COUNT 96  . Neutrophils Relative % 07/06/2015 41   Final  . Neutro Abs 07/06/2015 1.9  1.4 - 6.5 K/uL Final  . Lymphocytes Relative 07/06/2015 48   Final  . Lymphs Abs 07/06/2015 2.1  1.0 - 3.6 K/uL Final  . Monocytes Relative 07/06/2015 8   Final  . Monocytes Absolute 07/06/2015 0.4  0.2 - 0.9 K/uL Final  . Eosinophils Relative 07/06/2015 2   Final  . Eosinophils Absolute 07/06/2015 0.1  0 - 0.7 K/uL Final  . Basophils Relative 07/06/2015 1   Final  . Basophils Absolute 07/06/2015 0.1  0 - 0.1 K/uL Final    Assessment:  [name] is a 35 y.o. 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 Platelet count has ranged between 106,000 - 113,00. She is folate deficient.  She has drank alcohol (a pint a day) for the past 6 years. She has a history of liver cirrhosis  for 4-5 years. AFP was normal on 07/05/2015.  She has no history of splenomegaly. Diet is modest. She denies any new medications or herbal products.  Work-up on 06/05/2015 revealed the following normal labs: SPEP, immunoglobulins (IgG, IgA, and IgM), hepatitis B testing, hepatitis C testing, C-ANCA, P-ANCA, beta2-glocoprotein antibodies, ANA, anti-cardiolipin antibodies, lupus anticoagulant testing, C3, C4 , and LDH.  Work-up on 07/05/2014 confirmed folate deficiency.  B12 was low normal.  She has macrocytic RBC indices.  Platelet count in a blue top tube was 96,000.  Normal labs included the following:  PT/INR, PTT, TSH, and free T4.  Symptomatically, she has arthralgias. Exam reveals a palpable spleen tip.  Plan: 1.  Review  work-up.  No evidence of pseudothrombocytopenia.  Discuss low folate and need for supplimentation.  Discuss checking MMA to r/o B12 deficiency.  Discuss checking HIV status (patient consented).  Discuss ITP and contribution of alcohol to thrombocytopenia. 2.  Labs today:  HIV testing, MMA. 3.  Nurse to call patient with MMA result and if need to reinstitute vitamin B12. 4.  Follow-up with radiology regarding 03/15/2015 ultrasound and if spleen size is available. 5.  Rx:  folate 1 mg a day. 6.  Encourage discontinuation of alcohol. 7.  RTC in 6 weeks for MD assessment, review of today's labs, and labs (CBC with diff, folate).    Rosey Bath, MD  07/12/2015, 10:10 AM

## 2015-07-13 ENCOUNTER — Other Ambulatory Visit: Payer: Self-pay

## 2015-07-13 DIAGNOSIS — F902 Attention-deficit hyperactivity disorder, combined type: Secondary | ICD-10-CM

## 2015-07-13 NOTE — Telephone Encounter (Signed)
Patient called states adderall rx was written wrong she is taking 10mg  take 1.5 bid #90

## 2015-07-14 LAB — HIV ANTIBODY (ROUTINE TESTING W REFLEX): HIV Screen 4th Generation wRfx: NONREACTIVE

## 2015-07-14 LAB — METHYLMALONIC ACID, SERUM: Methylmalonic Acid, Quantitative: 53 nmol/L (ref 0–378)

## 2015-07-14 MED ORDER — DEXTROAMPHETAMINE SULFATE 10 MG PO TABS: 15.0000 mg | ORAL_TABLET | Freq: Two times a day (BID) | ORAL | Status: DC

## 2015-07-16 ENCOUNTER — Encounter: Payer: Self-pay | Admitting: Hematology and Oncology

## 2015-07-17 ENCOUNTER — Other Ambulatory Visit: Payer: Self-pay | Admitting: Hematology and Oncology

## 2015-08-09 ENCOUNTER — Other Ambulatory Visit: Payer: Self-pay | Admitting: Family Medicine

## 2015-08-09 DIAGNOSIS — F902 Attention-deficit hyperactivity disorder, combined type: Secondary | ICD-10-CM

## 2015-08-09 NOTE — Telephone Encounter (Signed)
Refill request was sent to Dr. Krichna Sowles for approval and submission.  

## 2015-08-09 NOTE — Telephone Encounter (Signed)
Pt needs refill on Dextroamphetamine 10 mg tabs.

## 2015-08-10 NOTE — Telephone Encounter (Signed)
Patient informed and appointment made with Dr Sherie DonLada for Monday

## 2015-08-14 ENCOUNTER — Ambulatory Visit (INDEPENDENT_AMBULATORY_CARE_PROVIDER_SITE_OTHER): Payer: Medicaid Other | Admitting: Family Medicine

## 2015-08-14 ENCOUNTER — Encounter: Payer: Self-pay | Admitting: Family Medicine

## 2015-08-14 VITALS — BP 108/72 | HR 103 | Temp 98.2°F | Resp 12 | Wt 112.8 lb

## 2015-08-14 DIAGNOSIS — D7589 Other specified diseases of blood and blood-forming organs: Secondary | ICD-10-CM | POA: Diagnosis not present

## 2015-08-14 DIAGNOSIS — J309 Allergic rhinitis, unspecified: Secondary | ICD-10-CM

## 2015-08-14 DIAGNOSIS — E538 Deficiency of other specified B group vitamins: Secondary | ICD-10-CM

## 2015-08-14 DIAGNOSIS — Z79899 Other long term (current) drug therapy: Secondary | ICD-10-CM

## 2015-08-14 DIAGNOSIS — F902 Attention-deficit hyperactivity disorder, combined type: Secondary | ICD-10-CM

## 2015-08-14 DIAGNOSIS — E039 Hypothyroidism, unspecified: Secondary | ICD-10-CM | POA: Diagnosis not present

## 2015-08-14 MED ORDER — FLUTICASONE PROPIONATE 50 MCG/ACT NA SUSP: 2.0000 | Freq: Every day | NASAL | Status: DC

## 2015-08-14 MED ORDER — DEXTROAMPHETAMINE SULFATE 10 MG PO TABS: 15.0000 mg | ORAL_TABLET | Freq: Two times a day (BID) | ORAL | Status: DC

## 2015-08-14 NOTE — Progress Notes (Signed)
BP 108/72 mmHg  Pulse 103  Temp(Src) 98.2 F (36.8 C) (Oral)  Resp 12  Wt 112 lb 12.8 oz (51.166 kg)  SpO2 99%   Subjective:    Patient ID: [name], female    DOB: Mar 15, 1981, 35 y.o.   MRN: [id number]  HPI: [name] is a 35 y.o. female  Chief Complaint  Patient presents with  . Medication Refill  . ADHD  . Hypothyroidism   Patient is new to me, as her previous provider left this practice  She was diagnosed with ADHD as an adult Medicine helps her stay focused and complete tasks; when she doesn't have the medicine, just doesn't work; tends to not finish things She would start one thing after another; trouble sleeping at night; working full time, once she got on the medicine and things leveled out Stay away from processed foods; lots of grilled chicken and veggies She sleeps well; she might be up late; sleeps solid for a few hours Currently happy with current dose of medicine, stable and not being adjusted  Hypothyroid; on medicine; weight stable; last TSH and free T4 recently checked; she had weight gain too; she's always been small; she couldn't lose weight after 2nd pregnancy; maternal uncle with thyroid trouble; hair loss; dry skin; BMs regular  She saw the rheumatologist; she had a positive ANA and then sent to hematologist Elevated MCV and MCH; they are considering B12 injections; she used to get them and do them herself; taking folic acid She had low platelets; they are checking for several other things; she had problems with these problems before she ever started drinking  Bad allergies; helped by flonase; pollen, needs refills  Takes ranitidine for burning in her throat; helped by medicine; knows her triggers, hot stuff and tomato stuff  Lorazepam, Dr. Juanetta Gosling in [place]; still taking she says; she takes it for anxiety; taking for 3 years  Right shoulder, right elbow, right knee; voltaren helps  Relevant past medical, surgical, family and social history  reviewed and updated as indicated Past Medical History  Diagnosis Date  . Alcohol abuse   . Thyroid disease   . ADD (attention deficit disorder)   . Cirrhosis (HCC)   . Depression    Past Surgical History  Procedure Laterality Date  . Appendectomy    . Cholecystectomy    . Abdominal hysterectomy     Family History  Problem Relation Age of Onset  . Cancer Father   . Hypertension Father    Social History  Substance Use Topics  . Smoking status: Current Every Day Smoker -- 2.00 packs/day    Types: Cigarettes  . Smokeless tobacco: None  . Alcohol Use: 0.0 oz/week    0 Standard drinks or equivalent per week     Comment: excessively   Interim medical history since last visit reviewed. No medical excitement since last visit Allergies and medications reviewed  Review of Systems Per HPI unless specifically indicated above     Objective:    BP 108/72 mmHg  Pulse 103  Temp(Src) 98.2 F (36.8 C) (Oral)  Resp 12  Wt 112 lb 12.8 oz (51.166 kg)  SpO2 99%  Wt Readings from Last 3 Encounters:  08/14/15 112 lb 12.8 oz (51.166 kg)  07/12/15 113 lb 3.3 oz (51.35 kg)  07/05/15 115 lb 4.8 oz (52.3 kg)    Physical Exam  Constitutional: She appears well-developed and well-nourished.  HENT:  Right Ear: Hearing normal.  Left Ear: Hearing normal.  Nose:  No rhinorrhea.  Mouth/Throat: Mucous membranes are normal.  Eyes: EOM are normal.  Cardiovascular: Regular rhythm.   No extrasystoles are present. Tachycardia present.   Borderline tachycardia, right at 100 during asucultation  Pulmonary/Chest: Effort normal and breath sounds normal.  Neurological: She displays no tremor.  Reflex Scores:      Patellar reflexes are 2+ on the right side and 2+ on the left side. Skin: No pallor.  Psychiatric: She has a normal mood and affect. Her behavior is normal. Judgment and thought content normal. Her mood appears not anxious. Her speech is not rapid and/or pressured. She is not hyperactive.  Cognition and memory are normal. She does not exhibit a depressed mood.   Results for orders placed or performed in visit on 07/12/15  Methylmalonic Acid, Serum  Result Value Ref Range   Methylmalonic Acid, Quantitative 53 0 - 378 nmol/L  HIV antibody  Result Value Ref Range   HIV Screen 4th Generation wRfx Non Reactive Non Reactive      Assessment & Plan:   Problem List Items Addressed This Visit      Endocrine   Hypothyroidism (acquired)    Recent TSH and free T4 from July 05, 2015 reviewed; continue same dose        Other   ADHD (attention deficit hyperactivity disorder), combined type - Primary   Relevant Medications   dextroamphetamine (DEXTROSTAT) 10 MG tablet      Follow up plan: Return in about 1 month (around 09/13/2015) for follow-up.  07/17/15 and 06/15/15  Meds ordered this encounter  Medications  . fluticasone (FLONASE) 50 MCG/ACT nasal spray    Sig: Place 2 sprays into both nostrils daily.    Dispense:  16 g    Refill:  11  . dextroamphetamine (DEXTROSTAT) 10 MG tablet    Sig: Take 1.5 tablets (15 mg total) by mouth 2 (two) times daily.    Dispense:  90 tablet    Refill:  0    Refill 07/14/15

## 2015-08-14 NOTE — Patient Instructions (Addendum)
Check out Answers to Distraction by Hallowell and [employer]y Try to use PLAIN allergy medicine without the decongestant Avoid: phenylephrine, phenylpropanolamine, and pseudoephredine Please return in one month for your next follow-up

## 2015-08-21 ENCOUNTER — Telehealth: Payer: Self-pay | Admitting: Hematology and Oncology

## 2015-08-21 NOTE — Telephone Encounter (Signed)
Returned call to pt and explained to her what labs are ordered and why they are order ed is to followo up on her platelet count, hgb and folate levels. She thanked me for calling back and stated OK I was just wandering

## 2015-08-21 NOTE — Telephone Encounter (Signed)
She would like a nurse to please call and let her know what blood work is being done on her upcoming visit. She said she has had labs drawn each visit and isn't sure why and wants to know. Thanks.

## 2015-08-23 ENCOUNTER — Ambulatory Visit: Payer: Medicaid Other | Admitting: Hematology and Oncology

## 2015-08-23 ENCOUNTER — Other Ambulatory Visit: Payer: Medicaid Other

## 2015-09-03 ENCOUNTER — Encounter: Payer: Self-pay | Admitting: Family Medicine

## 2015-09-03 DIAGNOSIS — Z79899 Other long term (current) drug therapy: Secondary | ICD-10-CM | POA: Insufficient documentation

## 2015-09-03 HISTORY — DX: Other long term (current) drug therapy: Z79.899

## 2015-09-03 NOTE — Assessment & Plan Note (Signed)
Recent TSH and free T4 from July 05, 2015 reviewed; continue same dose

## 2015-09-03 NOTE — Assessment & Plan Note (Signed)
Mildly low B12 and folic acid; suspect related to alcohol use as well; will address further at f/u appt in one month

## 2015-09-03 NOTE — Assessment & Plan Note (Signed)
Prescribed by another provider; I will not be managing or prescribing her benzo

## 2015-09-03 NOTE — Assessment & Plan Note (Signed)
Noted on previous labs from March; she should supplement

## 2015-09-03 NOTE — Assessment & Plan Note (Signed)
Flonase, avoid triggers when possible

## 2015-09-06 ENCOUNTER — Telehealth: Payer: Self-pay

## 2015-09-06 ENCOUNTER — Encounter: Payer: Self-pay | Admitting: Hematology and Oncology

## 2015-09-06 ENCOUNTER — Inpatient Hospital Stay: Payer: Medicaid Other | Attending: Hematology and Oncology

## 2015-09-06 ENCOUNTER — Inpatient Hospital Stay (HOSPITAL_BASED_OUTPATIENT_CLINIC_OR_DEPARTMENT_OTHER): Payer: Medicaid Other | Admitting: Hematology and Oncology

## 2015-09-06 VITALS — BP 114/83 | HR 90 | Temp 96.3°F | Resp 18 | Ht 64.0 in | Wt 111.9 lb

## 2015-09-06 DIAGNOSIS — M1990 Unspecified osteoarthritis, unspecified site: Secondary | ICD-10-CM | POA: Insufficient documentation

## 2015-09-06 DIAGNOSIS — L539 Erythematous condition, unspecified: Secondary | ICD-10-CM | POA: Insufficient documentation

## 2015-09-06 DIAGNOSIS — M25529 Pain in unspecified elbow: Secondary | ICD-10-CM

## 2015-09-06 DIAGNOSIS — F17210 Nicotine dependence, cigarettes, uncomplicated: Secondary | ICD-10-CM | POA: Diagnosis not present

## 2015-09-06 DIAGNOSIS — K7030 Alcoholic cirrhosis of liver without ascites: Secondary | ICD-10-CM

## 2015-09-06 DIAGNOSIS — Z79899 Other long term (current) drug therapy: Secondary | ICD-10-CM | POA: Insufficient documentation

## 2015-09-06 DIAGNOSIS — E079 Disorder of thyroid, unspecified: Secondary | ICD-10-CM | POA: Diagnosis not present

## 2015-09-06 DIAGNOSIS — D7589 Other specified diseases of blood and blood-forming organs: Secondary | ICD-10-CM

## 2015-09-06 DIAGNOSIS — R790 Abnormal level of blood mineral: Secondary | ICD-10-CM | POA: Insufficient documentation

## 2015-09-06 DIAGNOSIS — D696 Thrombocytopenia, unspecified: Secondary | ICD-10-CM | POA: Diagnosis not present

## 2015-09-06 DIAGNOSIS — F329 Major depressive disorder, single episode, unspecified: Secondary | ICD-10-CM | POA: Diagnosis not present

## 2015-09-06 DIAGNOSIS — M25569 Pain in unspecified knee: Secondary | ICD-10-CM | POA: Insufficient documentation

## 2015-09-06 DIAGNOSIS — E538 Deficiency of other specified B group vitamins: Secondary | ICD-10-CM

## 2015-09-06 DIAGNOSIS — D693 Immune thrombocytopenic purpura: Secondary | ICD-10-CM

## 2015-09-06 LAB — CBC WITH DIFFERENTIAL/PLATELET
Basophils Absolute: 0 10*3/uL (ref 0–0.1)
Basophils Relative: 1 %
Eosinophils Absolute: 0 10*3/uL (ref 0–0.7)
Eosinophils Relative: 2 %
HCT: 39.5 % (ref 35.0–47.0)
Hemoglobin: 13.2 g/dL (ref 12.0–16.0)
Lymphocytes Relative: 49 %
Lymphs Abs: 1.5 10*3/uL (ref 1.0–3.6)
MCH: 35.1 pg — ABNORMAL HIGH (ref 26.0–34.0)
MCHC: 33.5 g/dL (ref 32.0–36.0)
MCV: 104.8 fL — ABNORMAL HIGH (ref 80.0–100.0)
Monocytes Absolute: 0.3 10*3/uL (ref 0.2–0.9)
Monocytes Relative: 9 %
Neutro Abs: 1.2 10*3/uL — ABNORMAL LOW (ref 1.4–6.5)
Neutrophils Relative %: 39 %
Platelets: 89 10*3/uL — ABNORMAL LOW (ref 150–440)
RBC: 3.77 MIL/uL — ABNORMAL LOW (ref 3.80–5.20)
RDW: 14.2 % (ref 11.5–14.5)
WBC: 3 10*3/uL — ABNORMAL LOW (ref 3.6–11.0)

## 2015-09-06 LAB — FOLATE: Folate: 2.9 ng/mL — ABNORMAL LOW (ref >5.9)

## 2015-09-06 NOTE — Telephone Encounter (Signed)
  Called pt per MD and advised pt per MD to begin taking 2 mg of folate related to her labs being low.  Pt verbalized an understanding that she would go form 1 mg folate to 2 mg daily and we would recheck in a month.  No other concerns noted.

## 2015-09-06 NOTE — Progress Notes (Signed)
Pt is complaining pain in right elbow is drastically worse and tearful today related to that pain.  Right knee pain has also worsened since last visit.  Pt stated that something needs to give she has even vomited related to the pain.  Nothing is relieving it a heating pad at times can help.  Pt's stated she doesn't see rheumatologist until June and that she had referred her to us related to labs before.

## 2015-09-06 NOTE — Progress Notes (Signed)
[facility]-  Cancer Center  Clinic day:  09/06/2015   Chief Complaint: [name] is a 35 y.o. 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  HPI:  The patient was last seen in the medical oncology clinic on 07/12/2015.  At that time,work-up was reviewed.  There was no evidence of pseudothrombocytopenia.  We discussed her low folate and need for supplimentation. We discussed checking MMA to r/o B12 deficiency. We discussed checking HIV status. We discussed a diagnosis of ITP and contribution of alcohol to thrombocytopenia  MMA was 53 (normal), thus ruling a B12 deficiency.  HIV testing was negative.  Patient is taking folic acid.  Symptomatically, she notes pain in her elbow and knee.  She denies any trauma.  She is using a heat pack.  She was seen by Dr. Dayna Barker of rheumatology.  She was noted to have ANA+ and arthralgias of multiple sites.  Additional labs were drawn.  She has follow-up in 10/2015.  She denies any bruising or bleeding.   Past Medical History  Diagnosis Date  . Alcohol abuse   . Thyroid disease   . ADD (attention deficit disorder)   . Cirrhosis (HCC)   . Depression   . Chronic prescription benzodiazepine use 09/03/2015    Past Surgical History  Procedure Laterality Date  . Appendectomy    . Cholecystectomy    . Abdominal hysterectomy      Family History  Problem Relation Age of Onset  . Cancer Father   . Hypertension Father     Social History:  reports that she has been smoking Cigarettes.  She has been smoking about 1.00 pack per day. She does not have any smokeless tobacco history on file. She reports that she drinks alcohol. She reports that she does not use illicit drugs.  She has 2 children (age 37 and 29).  The patient has lived in [place] then [place] and is now back in [place].  The patient is alone today.  Allergies:  Allergies  Allergen  Reactions  . Penicillins Rash    Current Medications: Current Outpatient Prescriptions  Medication Sig Dispense Refill  . dextroamphetamine (DEXTROSTAT) 10 MG tablet Take 1.5 tablets (15 mg total) by mouth 2 (two) times daily. 90 tablet 0  . diclofenac sodium (VOLTAREN) 1 % GEL Apply 2 g topically daily as needed.    . fluticasone (FLONASE) 50 MCG/ACT nasal spray Place 2 sprays into both nostrils daily. 16 g 11  . folic acid (FOLVITE) 1 MG tablet Take 1 tablet (1 mg total) by mouth daily. 30 tablet 3  . levothyroxine (SYNTHROID, LEVOTHROID) 25 MCG tablet Take 25 mcg by mouth daily.    . ranitidine (ZANTAC) 300 MG tablet Take 300 mg by mouth every evening.    Marland Kitchen LORazepam (ATIVAN) 0.5 MG tablet Take 1 mg by mouth every 8 (eight) hours as needed. Reported on 09/06/2015     No current facility-administered medications for this visit.    Review of Systems:  GENERAL:  Feels "ok".  No fevers, sweats or weight loss. PERFORMANCE STATUS (ECOG):  1 HEENT:  No visual changes, runny nose, sore throat, mouth sores or tenderness. Lungs:  Sortness of breath with exertion.  No cough.  No hemoptysis. Cardiac:  No chest pain, palpitations, orthopnea, or PND. GI:  Occasional nausea.  No vomiting, diarrhea, constipation, melena or hematochezia. GU:  No urgency, frequency, dysuria, or hematuria. Musculoskeletal:  Shoulder  and elbow pain.  No back pain.  No muscle tenderness. Extremities:  Hair thin.  No pain or swelling. Skin:  No rashes or skin changes. Neuro:  No headache, numbness or weakness, balance or coordination issues. Endocrine:  Thyroid disease on Synthroid.  No diabetes, hot flashes or night sweats. Psych:  No mood changes, depression or anxiety. Pain:  No focal pain. Review of systems:  All other systems reviewed and found to be negative.  Physical Exam: Blood pressure 114/83, pulse 90, temperature 96.3 F (35.7 C), temperature source Tympanic, resp. rate 18, height  (1.626 m), weight  111 lb 14.1 oz (50.75 kg). GENERAL:  Well developed, well nourished, sitting comfortably in the exam room in no acute distress. MENTAL STATUS:  Alert and oriented to person, place and time. HEAD:  Blonde hair.  Facial erythema.  Normocephalic, atraumatic, face symmetric, no Cushingoid features. EYES:  Blue eyes.  Pupils equal round and reactive to light and accomodation. No conjunctivitis or scleral icterus. ENT: Oropharynx clear without lesion. Upper dentures. Tongue normal. Mucous membranes moist. Left nares pierced. RESPIRATORY: Clear to auscultation without rales, wheezes or rhonchi. CARDIOVASCULAR: Regular rate and rhythm without murmur, rub or gallop. ABDOMEN: Soft, non-tender, with active bowel sounds, and no hepatomegaly. Spleen tip palpable. No masses. SKIN: Tattoos on arms. No rashes, ulcers or lesions. EXTREMITIES: No edema, no skin discoloration or tenderness. No palpable cords. LYMPH NODES: No palpable cervical, supraclavicular, axillary or inguinal adenopathy  NEUROLOGICAL: Unremarkable. PSYCH: Appropriate.   Appointment on 09/06/2015  Component Date Value Ref Range Status  . WBC 09/06/2015 3.0* 3.6 - 11.0 K/uL Final  . RBC 09/06/2015 3.77* 3.80 - 5.20 MIL/uL Final  . Hemoglobin 09/06/2015 13.2  12.0 - 16.0 g/dL Final  . HCT 16/02/9603 39.5  35.0 - 47.0 % Final  . MCV 09/06/2015 104.8* 80.0 - 100.0 fL Final  . MCH 09/06/2015 35.1* 26.0 - 34.0 pg Final  . MCHC 09/06/2015 33.5  32.0 - 36.0 g/dL Final  . RDW 54/01/8118 14.2  11.5 - 14.5 % Final  . Platelets 09/06/2015 89* 150 - 440 K/uL Final  . Neutrophils Relative % 09/06/2015 39%   Final  . Neutro Abs 09/06/2015 1.2* 1.4 - 6.5 K/uL Final  . Lymphocytes Relative 09/06/2015 49%   Final  . Lymphs Abs 09/06/2015 1.5  1.0 - 3.6 K/uL Final  . Monocytes Relative 09/06/2015 9%   Final  . Monocytes Absolute 09/06/2015 0.3  0.2 - 0.9 K/uL Final  . Eosinophils Relative 09/06/2015 2%   Final  . Eosinophils Absolute  09/06/2015 0.0  0 - 0.7 K/uL Final  . Basophils Relative 09/06/2015 1%   Final  . Basophils Absolute 09/06/2015 0.0  0 - 0.1 K/uL Final    Assessment:  [name] is a 35 y.o. 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 Platelet count has ranged between 106,000 - 113,00. She is folate deficient.  She has drank alcohol (a pint a day) for the past 6 years. She has a history of liver cirrhosis for 4-5 years. AFP was normal on 07/05/2015.  She has no history of splenomegaly. Diet is modest. She denies any new medications or herbal products.  Work-up on 06/05/2015 revealed the following normal labs: SPEP, immunoglobulins (IgG, IgA, and IgM), hepatitis B testing, hepatitis C testing, C-ANCA, P-ANCA, beta2-glocoprotein antibodies, anti-cardiolipin antibodies, lupus anticoagulant testing, C3, C4 , and LDH.  ANA was positive on 05/24/2015.  Work-up on 07/05/2015 confirmed folate deficiency.  B12 was low normal.  MMA was 53 (normal), thus ruling a B12 deficiency.  She has macrocytic RBC indices.  Platelet count in a blue top tube was 96,000.  Normal labs included the following:  PT/INR, PTT, TSH, and free T4.  HIV testing was negative.  Symptomatically, she has arthralgias. Exam reveals a palpable spleen tip.  Plan: 1.  Review labs from last visit. 2.  Labs today:  CBC with diff, folate. 3.  Continue folic acid. 4.  Abdominal ultrasound:  assess liver and spleen. 4.  RTC in 1 month for CBC with diff. 5.  RTC in 2 months for MD assessment and labs (CBC with diff).  Addendum:  Folic acid 2.9 (low).  Patient was called to increase folic acid from 1 mg a day to 2 mg a day.  Folate level will be rechecked in 1 month.    Rosey BathMelissa C Corcoran, MD  09/06/2015, 10:37 AM

## 2015-09-11 ENCOUNTER — Ambulatory Visit: Payer: Medicaid Other

## 2015-09-12 ENCOUNTER — Ambulatory Visit (INDEPENDENT_AMBULATORY_CARE_PROVIDER_SITE_OTHER): Payer: Medicaid Other | Admitting: Family Medicine

## 2015-09-12 ENCOUNTER — Encounter: Payer: Self-pay | Admitting: Family Medicine

## 2015-09-12 VITALS — BP 120/78 | HR 98 | Temp 98.6°F | Resp 14 | Wt 112.0 lb

## 2015-09-12 DIAGNOSIS — F10180 Alcohol abuse with alcohol-induced anxiety disorder: Secondary | ICD-10-CM | POA: Diagnosis not present

## 2015-09-12 DIAGNOSIS — Z79899 Other long term (current) drug therapy: Secondary | ICD-10-CM

## 2015-09-12 DIAGNOSIS — K7030 Alcoholic cirrhosis of liver without ascites: Secondary | ICD-10-CM | POA: Diagnosis not present

## 2015-09-12 DIAGNOSIS — F902 Attention-deficit hyperactivity disorder, combined type: Secondary | ICD-10-CM

## 2015-09-12 DIAGNOSIS — E039 Hypothyroidism, unspecified: Secondary | ICD-10-CM

## 2015-09-12 DIAGNOSIS — D696 Thrombocytopenia, unspecified: Secondary | ICD-10-CM | POA: Diagnosis not present

## 2015-09-12 MED ORDER — DEXTROAMPHETAMINE SULFATE 10 MG PO TABS: 15.0000 mg | ORAL_TABLET | Freq: Two times a day (BID) | ORAL | Status: DC

## 2015-09-12 MED ORDER — DEXTROAMPHETAMINE SULFATE 10 MG PO TABS
15.0000 mg | ORAL_TABLET | Freq: Two times a day (BID) | ORAL | Status: DC
Start: 2015-09-12 — End: 2015-09-12

## 2015-09-12 NOTE — Progress Notes (Signed)
BP 120/78 mmHg  Pulse 98  Temp(Src) 98.6 F (37 C) (Oral)  Resp 14  Wt 112 lb (50.803 kg)  SpO2 99%   Subjective:    Patient ID: [name], female    DOB: Sep 01, 1980, 35 y.o.   MRN: [id number]  HPI: [name] is a 35 y.o. female  Chief Complaint  Patient presents with  . Follow-up    1 month   Hypothyroidism; going on for a few years; has been maintained on low dose; energy level is not good; feels tired a lot; feels wore out;  Dealing with rheumatologist and hematologist; saw rheum in January; sees her in June  Reviewed heme note; bleeds a little when cut; no nosebleeds, no gum bleeding, no blood in urine and stool  Cirrhosis for 4-5 years; drinks alcohol; she ordered labs, pending ADHD is about the same; taking 15 mg of dextrostat; she prefers to have the 10 mg, used to that and doesn't want to switch; not jittery; no unexplained weight loss; able to sleep at night okay; the medicine helps her function and stay focused; without the medicine, runs circles around herself  She has decreased her alcohol consumption; she has a book about sobriety; she has tools and things to read  Depression screen [facility] 2/9 08/14/2015 04/05/2015  Decreased Interest 0 0  Down, Depressed, Hopeless 0 0  PHQ - 2 Score 0 0   Relevant past medical, surgical, family and social history reviewed Past Medical History  Diagnosis Date  . Alcohol abuse   . Thyroid disease   . ADD (attention deficit disorder)   . Cirrhosis (HCC)   . Depression   . Chronic prescription benzodiazepine use 09/03/2015   Past Surgical History  Procedure Laterality Date  . Appendectomy    . Cholecystectomy    . Abdominal hysterectomy     Family History  Problem Relation Age of Onset  . Cancer Father   . Hypertension Father    Social History  Substance Use Topics  . Smoking status: Current Every Day Smoker -- 1.00 packs/day    Types: Cigarettes  . Smokeless tobacco: None  . Alcohol Use: 0.0 oz/week    0  Standard drinks or equivalent per week     Comment: excessively   Interim medical history since last visit reviewed. Allergies and medications reviewed  Review of Systems Per HPI unless specifically indicated above     Objective:    BP 120/78 mmHg  Pulse 98  Temp(Src) 98.6 F (37 C) (Oral)  Resp 14  Wt 112 lb (50.803 kg)  SpO2 99%  Wt Readings from Last 3 Encounters:  09/12/15 112 lb (50.803 kg)  09/06/15 111 lb 14.1 oz (50.75 kg)  08/14/15 112 lb 12.8 oz (51.166 kg)    Physical Exam  Constitutional: She appears well-developed and well-nourished.  Eyes: EOM are normal.  Cardiovascular: Normal rate and regular rhythm.   No extrasystoles are present.  Pulmonary/Chest: Effort normal and breath sounds normal.  Abdominal: She exhibits no distension.  Neurological: She is alert. She displays no tremor.  Reflex Scores:      Patellar reflexes are 2+ on the right side and 2+ on the left side. Psychiatric: She has a normal mood and affect. Her behavior is normal. Judgment and thought content normal. Her mood appears not anxious. Her speech is not rapid and/or pressured. Cognition and memory are normal.    Results for orders placed or performed in visit on 09/06/15  CBC with Differential  Result Value Ref Range   WBC 3.0 (L) 3.6 - 11.0 K/uL   RBC 3.77 (L) 3.80 - 5.20 MIL/uL   Hemoglobin 13.2 12.0 - 16.0 g/dL   HCT 16.1 09.6 - 04.5 %   MCV 104.8 (H) 80.0 - 100.0 fL   MCH 35.1 (H) 26.0 - 34.0 pg   MCHC 33.5 32.0 - 36.0 g/dL   RDW 40.9 81.1 - 91.4 %   Platelets 89 (L) 150 - 440 K/uL   Neutrophils Relative % 39% %   Neutro Abs 1.2 (L) 1.4 - 6.5 K/uL   Lymphocytes Relative 49% %   Lymphs Abs 1.5 1.0 - 3.6 K/uL   Monocytes Relative 9% %   Monocytes Absolute 0.3 0.2 - 0.9 K/uL   Eosinophils Relative 2% %   Eosinophils Absolute 0.0 0 - 0.7 K/uL   Basophils Relative 1% %   Basophils Absolute 0.0 0 - 0.1 K/uL  Folate  Result Value Ref Range   Folate 2.9 (L) >5.9 ng/mL        Assessment & Plan:   Problem List Items Addressed This Visit      Digestive   Liver cirrhosis (HCC)    Patient is seeing heme-onc; patient is not interested in seeing gastroenterologist as well right now; she continues to drink but is trying to cut back        Endocrine   Hypothyroidism (acquired)    Doing well on current dose; check TSH yearly; last TSH reviewed        Nervous and Auditory   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)    She is trying to cut back; I shared with her [email or web address], website with information to help her cut back; AA is available; she has resources too        Other   ADHD (attention deficit hyperactivity disorder), combined type - Primary   Relevant Medications   dextroamphetamine (DEXTROSTAT) 10 MG tablet   Chronic prescription benzodiazepine use    I will not prescribe benzos for this patient; see last visit      Thrombocytopenia (HCC)    Seeing heme-onc; 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  An after-visit summary was printed and given to the patient at check-out.  Please see the patient instructions which may contain other information and recommendations beyond what is mentioned above in the assessment and plan.  Meds ordered this encounter  Medications  . DISCONTD: dextroamphetamine (DEXTROSTAT) 10 MG tablet    Sig: Take 1.5 tablets (15 mg total) by mouth 2 (two) times daily.    Dispense:  90 tablet    Refill:  0    Refill 07/14/15  . DISCONTD: dextroamphetamine (DEXTROSTAT) 10 MG tablet    Sig: Take 1.5 tablets (15 mg total) by mouth 2 (two) times daily.    Dispense:  90 tablet    Refill:  0    Fill on or after October 12, 2015  . dextroamphetamine (DEXTROSTAT) 10 MG tablet    Sig: Take 1.5 tablets (15 mg total) by mouth 2 (two) times daily.    Dispense:  90  tablet    Refill:  0    Fill on or after November 11, 2015   All three months of prescriptions were given to patient at the hallway by the printer; I explained that  if she loses the prescriptions, they will not necessarily be replaced

## 2015-09-12 NOTE — Assessment & Plan Note (Signed)
Doing well on current dose; check TSH yearly; last TSH reviewed

## 2015-09-12 NOTE — Assessment & Plan Note (Signed)
I will not prescribe benzos for this patient; see last visit

## 2015-09-12 NOTE — Patient Instructions (Addendum)
  Check out [email or web address]  Continue same medicines  Check out the book Answers to Distraction by [facility] and Ratey Return in 3 months, sooner if needed

## 2015-09-12 NOTE — Assessment & Plan Note (Signed)
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

## 2015-09-12 NOTE — Assessment & Plan Note (Signed)
Patient is seeing heme-onc; patient is not interested in seeing gastroenterologist as well right now; she continues to drink but is trying to cut back

## 2015-09-12 NOTE — Assessment & Plan Note (Signed)
She is trying to cut back; I shared with her [email or web address], website with information to help her cut back; AA is available; she has resources too

## 2015-10-03 ENCOUNTER — Encounter: Payer: Self-pay | Admitting: Hematology and Oncology

## 2015-10-10 ENCOUNTER — Inpatient Hospital Stay: Payer: Medicaid Other | Attending: Hematology and Oncology

## 2015-10-10 ENCOUNTER — Other Ambulatory Visit: Payer: Self-pay | Admitting: *Deleted

## 2015-10-10 ENCOUNTER — Telehealth: Payer: Self-pay | Admitting: *Deleted

## 2015-10-10 DIAGNOSIS — D693 Immune thrombocytopenic purpura: Secondary | ICD-10-CM | POA: Diagnosis not present

## 2015-10-10 DIAGNOSIS — E538 Deficiency of other specified B group vitamins: Secondary | ICD-10-CM

## 2015-10-10 DIAGNOSIS — D7589 Other specified diseases of blood and blood-forming organs: Secondary | ICD-10-CM

## 2015-10-10 DIAGNOSIS — K7030 Alcoholic cirrhosis of liver without ascites: Secondary | ICD-10-CM

## 2015-10-10 DIAGNOSIS — D696 Thrombocytopenia, unspecified: Secondary | ICD-10-CM

## 2015-10-10 LAB — CBC WITH DIFFERENTIAL/PLATELET
Basophils Absolute: 0.1 10*3/uL (ref 0–0.1)
Basophils Relative: 3 %
Eosinophils Absolute: 0.1 10*3/uL (ref 0–0.7)
Eosinophils Relative: 2 %
HCT: 39.3 % (ref 35.0–47.0)
Hemoglobin: 13.3 g/dL (ref 12.0–16.0)
Lymphocytes Relative: 46 %
Lymphs Abs: 1.7 10*3/uL (ref 1.0–3.6)
MCH: 35.7 pg — ABNORMAL HIGH (ref 26.0–34.0)
MCHC: 33.9 g/dL (ref 32.0–36.0)
MCV: 105.2 fL — ABNORMAL HIGH (ref 80.0–100.0)
Monocytes Absolute: 0.3 10*3/uL (ref 0.2–0.9)
Monocytes Relative: 8 %
Neutro Abs: 1.5 10*3/uL (ref 1.4–6.5)
Neutrophils Relative %: 41 %
Platelets: 75 10*3/uL — ABNORMAL LOW (ref 150–440)
RBC: 3.73 MIL/uL — ABNORMAL LOW (ref 3.80–5.20)
RDW: 14.6 % — ABNORMAL HIGH (ref 11.5–14.5)
WBC: 3.6 10*3/uL (ref 3.6–11.0)

## 2015-10-10 LAB — FOLATE: Folate: 3.2 ng/mL — ABNORMAL LOW (ref >5.9)

## 2015-10-10 NOTE — Telephone Encounter (Signed)
-----   Message from Rosey BathMelissa C Corcoran, MD sent at 10/10/2015 12:56 PM EDT ----- Regarding: Low folate  Folate still low.  Last note of mine 1 month ago stated that she was taking folate 1 mg and level was low.  We thus increased folate to 2 mg with recheck level now.  Current level is still low.  Call to confirm she is taking folic acid 2 mg a day.  If so, increase to 3 mg a day and recheck level in 1 month.  M  ----- Message -----    From: [facility]    Sent: 10/10/2015  10:01 AM      To: Rosey BathMelissa C Corcoran, MD

## 2015-10-10 NOTE — Telephone Encounter (Signed)
Called pt and verified that she is taking 2 mg every day of folate and she has not missed any doses.  I told her that you advised to go to 3 mg every day and recheck in 1 month.  Pt already has appt for 7/5 which will be a month so I added folate to her labs for that day. Pt agreeable to plan

## 2015-10-11 ENCOUNTER — Telehealth: Payer: Self-pay | Admitting: Hematology and Oncology

## 2015-10-11 ENCOUNTER — Other Ambulatory Visit: Payer: Medicaid Other

## 2015-10-11 NOTE — Telephone Encounter (Signed)
Patient wants to talk to you about bone marrow test after seeing her rheumatologist. She now wants to do it. Please cb: [phone] OR [phone].

## 2015-10-11 NOTE — Telephone Encounter (Signed)
Re:  Patient's questions about bone marrow  Called patient's mobile number about her questions about a bone marrow requested by her other physician.  Message left to return call to office.  Rosey BathMelissa C Corcoran, MD

## 2015-11-08 ENCOUNTER — Inpatient Hospital Stay: Payer: Medicaid Other | Attending: Hematology and Oncology

## 2015-11-08 ENCOUNTER — Inpatient Hospital Stay (HOSPITAL_BASED_OUTPATIENT_CLINIC_OR_DEPARTMENT_OTHER): Payer: Medicaid Other | Admitting: Hematology and Oncology

## 2015-11-08 ENCOUNTER — Encounter: Payer: Self-pay | Admitting: Hematology and Oncology

## 2015-11-08 VITALS — BP 132/90 | HR 107 | Temp 98.3°F | Wt 108.4 lb

## 2015-11-08 DIAGNOSIS — F17210 Nicotine dependence, cigarettes, uncomplicated: Secondary | ICD-10-CM | POA: Diagnosis not present

## 2015-11-08 DIAGNOSIS — E538 Deficiency of other specified B group vitamins: Secondary | ICD-10-CM | POA: Diagnosis not present

## 2015-11-08 DIAGNOSIS — M25521 Pain in right elbow: Secondary | ICD-10-CM | POA: Diagnosis not present

## 2015-11-08 DIAGNOSIS — Z880 Allergy status to penicillin: Secondary | ICD-10-CM

## 2015-11-08 DIAGNOSIS — Z79899 Other long term (current) drug therapy: Secondary | ICD-10-CM | POA: Insufficient documentation

## 2015-11-08 DIAGNOSIS — D693 Immune thrombocytopenic purpura: Secondary | ICD-10-CM | POA: Diagnosis not present

## 2015-11-08 DIAGNOSIS — K7030 Alcoholic cirrhosis of liver without ascites: Secondary | ICD-10-CM | POA: Diagnosis not present

## 2015-11-08 DIAGNOSIS — R0609 Other forms of dyspnea: Secondary | ICD-10-CM

## 2015-11-08 DIAGNOSIS — D696 Thrombocytopenia, unspecified: Secondary | ICD-10-CM

## 2015-11-08 DIAGNOSIS — L539 Erythematous condition, unspecified: Secondary | ICD-10-CM | POA: Diagnosis not present

## 2015-11-08 DIAGNOSIS — E079 Disorder of thyroid, unspecified: Secondary | ICD-10-CM | POA: Diagnosis not present

## 2015-11-08 LAB — CBC WITH DIFFERENTIAL/PLATELET
Basophils Absolute: 0 10*3/uL (ref 0–0.1)
Basophils Relative: 1 %
Eosinophils Absolute: 0.1 10*3/uL (ref 0–0.7)
Eosinophils Relative: 2 %
HCT: 40.4 % (ref 35.0–47.0)
Hemoglobin: 13.6 g/dL (ref 12.0–16.0)
Lymphocytes Relative: 39 %
Lymphs Abs: 1.4 10*3/uL (ref 1.0–3.6)
MCH: 35.7 pg — ABNORMAL HIGH (ref 26.0–34.0)
MCHC: 33.7 g/dL (ref 32.0–36.0)
MCV: 105.9 fL — ABNORMAL HIGH (ref 80.0–100.0)
Monocytes Absolute: 0.3 10*3/uL (ref 0.2–0.9)
Monocytes Relative: 7 %
Neutro Abs: 1.9 10*3/uL (ref 1.4–6.5)
Neutrophils Relative %: 51 %
Platelets: 94 10*3/uL — ABNORMAL LOW (ref 150–440)
RBC: 3.81 MIL/uL (ref 3.80–5.20)
RDW: 14 % (ref 11.5–14.5)
WBC: 3.7 10*3/uL (ref 3.6–11.0)

## 2015-11-08 LAB — FOLATE: Folate: 7.6 ng/mL (ref >5.9)

## 2015-11-08 NOTE — Progress Notes (Signed)
Patient ambulates without assistance, brought to exam room 3.  Patient states she has chronic bilateral elbow, but states she experiences more pain in the right elbow and bilateral knee pain but patient states she experiences more pain in the right knee. 8 oof 10 in pain.  BP 132/90, HR 107, vitals documented.  Medication record updated, information provided by patient.  Dr notified .

## 2015-11-08 NOTE — Progress Notes (Signed)
[facility] day:  11/08/2015   Chief Complaint: [name] is a 35 y.o. 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  HPI:  The patient was last seen in the medical oncology clinic on 09/06/2015.  At that time, she had arthralgias.  Spleen tip was palpable.  Platelet count was 89,000.  Folic acid was 2.9 (low).  Folic acid was increased from 1 mg a day to 2 mg a day.    After 1 month on folic acid 2 mg a day, folate was 3.2 on 10/10/2015.  Folic acid was increased to 3 mg a day.  Symptomatically, she has been "fair".  She notes arm pain and right elbow pain.  She feels her joints are not better.  She saw rheumatology.  She is smoking. She is drinking 1 alcoholic beverage a day.  She is taking folic acid 3 mg a day.  She denies any bruising or bleeding.   Past Medical History  Diagnosis Date  . Alcohol abuse   . Thyroid disease   . ADD (attention deficit disorder)   . Cirrhosis ([place])   . Depression   . Chronic prescription benzodiazepine use 09/03/2015    Past Surgical History  Procedure Laterality Date  . Appendectomy    . Cholecystectomy    . Abdominal hysterectomy      Family History  Problem Relation Age of Onset  . Cancer Father   . Hypertension Father     Social History:  reports that she has been smoking Cigarettes.  She has been smoking about 1.00 pack per day. She does not have any smokeless tobacco history on file. She reports that she drinks alcohol. She reports that she does not use illicit drugs.  She has 2 children (age 33 and 40).  The patient has lived in [place] then [place] and is now back in [place].  The patient is alone today.  Allergies:  Allergies  Allergen Reactions  . Penicillins Rash    Current Medications: Current Outpatient Prescriptions  Medication Sig Dispense Refill  . dextroamphetamine (DEXTROSTAT) 10 MG tablet Take 1.5 tablets  (15 mg total) by mouth 2 (two) times daily. 90 tablet 0  . diclofenac sodium (VOLTAREN) 1 % GEL Apply 2 g topically daily as needed.    . fluticasone (FLONASE) 50 MCG/ACT nasal spray Place 2 sprays into both nostrils daily. 16 g 11  . folic acid (FOLVITE) 1 MG tablet Take 1 tablet (1 mg total) by mouth daily. 30 tablet 3  . levothyroxine (SYNTHROID, LEVOTHROID) 25 MCG tablet Take 25 mcg by mouth daily.    . ranitidine (ZANTAC) 300 MG tablet Take 300 mg by mouth every evening.    . buprenorphine (BUTRANS - DOSED MCG/HR) 5 MCG/HR PTWK patch Place 5 mcg onto the skin. Reported on 11/08/2015     No current facility-administered medications for this visit.    Review of Systems:  GENERAL:  Feels "fair".  No fevers or sweats.  Weight loss of 3 pounds. PERFORMANCE STATUS (ECOG):  1 HEENT:  No visual changes, runny nose, sore throat, mouth sores or tenderness. Lungs:  Shortness of breath with exertion.  No cough.  No hemoptysis. Cardiac:  No chest pain, palpitations, orthopnea, or PND. GI:  Nausea related to joint pain.No vomiting, diarrhea, constipation, melena or hematochezia. GU:  No urgency, frequency, dysuria, or hematuria. Musculoskeletal:  Shoulder and right elbow pain.  No back pain.  No muscle tenderness. Extremities:  Hair thin.  No pain or swelling. Skin:  No rashes or skin changes. Neuro:  No headache, numbness or weakness, balance or coordination issues. Endocrine:  Thyroid disease on Synthroid.  No diabetes, hot flashes or night sweats. Psych:  No mood changes, depression or anxiety. Pain:  No focal pain. Review of systems:  All other systems reviewed and found to be negative.  Physical Exam: Blood pressure 132/90, pulse 107, temperature 98.3 F (36.8 C), temperature source Tympanic, weight 108 lb 5.7 oz (49.15 kg). GENERAL:  Well developed, well nourished, woman sitting comfortably in the exam room in no acute distress. MENTAL STATUS:  Alert and oriented to person, place and  time. HEAD:  Blonde hair.  Facial erythema.  Normocephalic, atraumatic, face symmetric, no Cushingoid features. EYES:  Blue eyes.  Pupils equal round and reactive to light and accomodation. No conjunctivitis or scleral icterus. ENT: Oropharynx clear without lesion. Upper dentures. Tongue normal. Mucous membranes moist. Left nares pierced. RESPIRATORY: Clear to auscultation without rales, wheezes or rhonchi. CARDIOVASCULAR: Regular rate and rhythm without murmur, rub or gallop. ABDOMEN: Soft, non-tender, with active bowel sounds, and no hepatomegaly. Spleen tip palpable. No masses. SKIN: Tattoos on arms. No rashes, ulcers or lesions. EXTREMITIES: No edema, no skin discoloration or tenderness. No palpable cords. LYMPH NODES: No palpable cervical, supraclavicular, axillary or inguinal adenopathy  NEUROLOGICAL: Unremarkable. PSYCH: Appropriate.   Appointment on 11/08/2015  Component Date Value Ref Range Status  . WBC 11/08/2015 3.7  3.6 - 11.0 K/uL Final  . RBC 11/08/2015 3.81  3.80 - 5.20 MIL/uL Final  . Hemoglobin 11/08/2015 13.6  12.0 - 16.0 g/dL Final  . HCT 11/08/2015 40.4  35.0 - 47.0 % Final  . MCV 11/08/2015 105.9* 80.0 - 100.0 fL Final  . MCH 11/08/2015 35.7* 26.0 - 34.0 pg Final  . MCHC 11/08/2015 33.7  32.0 - 36.0 g/dL Final  . RDW 11/08/2015 14.0  11.5 - 14.5 % Final  . Platelets 11/08/2015 94* 150 - 440 K/uL Final  . Neutrophils Relative % 11/08/2015 51%   Final  . Neutro Abs 11/08/2015 1.9  1.4 - 6.5 K/uL Final  . Lymphocytes Relative 11/08/2015 39%   Final  . Lymphs Abs 11/08/2015 1.4  1.0 - 3.6 K/uL Final  . Monocytes Relative 11/08/2015 7%   Final  . Monocytes Absolute 11/08/2015 0.3  0.2 - 0.9 K/uL Final  . Eosinophils Relative 11/08/2015 2%   Final  . Eosinophils Absolute 11/08/2015 0.1  0 - 0.7 K/uL Final  . Basophils Relative 11/08/2015 1%   Final  . Basophils Absolute 11/08/2015 0.0  0 - 0.1 K/uL Final    Assessment:  [name] is a 35 y.o.  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 Platelet count has ranged between 106,000 - 113,00. She is folate deficient.  She has drank alcohol (a pint a day) for the past 6 years. She has a history of liver cirrhosis for 4-5 years. AFP was normal on 07/05/2015.  She has no history of splenomegaly. Diet is modest. She denies any new medications or herbal products.  Work-up on 06/05/2015 revealed the following normal labs: SPEP, immunoglobulins (IgG, IgA, and IgM), hepatitis B testing, hepatitis C testing, C-ANCA, P-ANCA, beta2-glocoprotein antibodies, anti-cardiolipin antibodies, lupus anticoagulant testing, C3, C4 , and LDH.  ANA was positive on 05/24/2015.  Work-up on 07/05/2015 confirmed folate deficiency.  She is taking folic acid 3 mg a day.  B12 was low normal.  MMA was 53 (normal), thus ruling a B12 deficiency.  She has macrocytic RBC indices.  She does not have pseudo-thrombocytopenia (platelet count in a blue top tube was 96,000).  Normal labs included the following:  PT/INR, PTT, TSH, and free T4.  HIV testing was negative.  Symptomatically, she has arthralgias. Exam reveals a palpable spleen tip.  Platelet count is 94,000.  Plan: 1.  Labs today:  CBC with diff, folate. 2.  Reschedule abdominal ultrasound  3.  Encourage discontinuation of alcohol. 4.  Follow-up with rheumatology 5.  RTC in 1 month for labs (CBC with diff, ferritin, iron studies, ESR, folate, B12) 6.  RTC in 2 months for MD assess and labs (CBC with diff +/- others).    Lequita Asal, MD  11/08/2015, 11:21 AM

## 2015-11-13 ENCOUNTER — Ambulatory Visit: Payer: Medicaid Other

## 2015-11-13 ENCOUNTER — Telehealth: Payer: Self-pay

## 2015-11-13 NOTE — Telephone Encounter (Signed)
Patient called and left message on triage line that she needed to cancel US for this am at 10am.  She did not leave a reason why, and that she did not want to reschedule at this time.  Attempted to follow up with patient-no answer.  Send message to Dr. Merlene Pullingorcoran.

## 2015-12-06 ENCOUNTER — Ambulatory Visit: Payer: Medicaid Other

## 2015-12-06 ENCOUNTER — Other Ambulatory Visit: Payer: Medicaid Other

## 2015-12-13 ENCOUNTER — Ambulatory Visit (INDEPENDENT_AMBULATORY_CARE_PROVIDER_SITE_OTHER): Payer: Medicaid Other | Admitting: Family Medicine

## 2015-12-13 ENCOUNTER — Encounter: Payer: Self-pay | Admitting: Family Medicine

## 2015-12-13 VITALS — BP 110/64 | HR 105 | Temp 98.2°F | Resp 16 | Wt 110.0 lb

## 2015-12-13 DIAGNOSIS — Z79899 Other long term (current) drug therapy: Secondary | ICD-10-CM | POA: Diagnosis not present

## 2015-12-13 DIAGNOSIS — D7589 Other specified diseases of blood and blood-forming organs: Secondary | ICD-10-CM | POA: Diagnosis not present

## 2015-12-13 DIAGNOSIS — E039 Hypothyroidism, unspecified: Secondary | ICD-10-CM

## 2015-12-13 DIAGNOSIS — G8929 Other chronic pain: Secondary | ICD-10-CM

## 2015-12-13 DIAGNOSIS — F10180 Alcohol abuse with alcohol-induced anxiety disorder: Secondary | ICD-10-CM | POA: Diagnosis not present

## 2015-12-13 DIAGNOSIS — F902 Attention-deficit hyperactivity disorder, combined type: Secondary | ICD-10-CM

## 2015-12-13 DIAGNOSIS — E538 Deficiency of other specified B group vitamins: Secondary | ICD-10-CM

## 2015-12-13 DIAGNOSIS — M25521 Pain in right elbow: Secondary | ICD-10-CM | POA: Diagnosis not present

## 2015-12-13 DIAGNOSIS — M25561 Pain in right knee: Secondary | ICD-10-CM | POA: Diagnosis not present

## 2015-12-13 NOTE — Assessment & Plan Note (Addendum)
Refer to psych; I explained that I will not be prescribing her controlled substance any longer since the fill of tramadol violated our controlled substance agreement; I recommended [place] (only handle it once), try to limit number of outstanding things to do, suggested counseling/therapy if life becomes stressful, etc.; she will not be following up here she said

## 2015-12-13 NOTE — Progress Notes (Signed)
BP 110/64   Pulse (!) 105   Temp 98.2 F (36.8 C) (Oral)   Resp 16   Wt 110 lb (49.9 kg)   SpO2 98%   BMI 18.88 kg/m    Subjective:    Patient ID: [name], female    DOB: [birth year], 35 y.o.   MRN: [id number]  HPI: [name] is a 35 y.o. female  Chief Complaint  Patient presents with  . Follow-up   Patient is here for f/u She is going to see an orthopaedist; she was seeing a hematologist and a rheumatologist, but not getting the answers She is having shoulder pain and elbow and hip and knee pain; the knee and elbow are really bothering her She has not seen ortho yet  The rheumatologist did bloodwork and xrays, the lab showed positive ANA, then [facility] gave her a referral to rheum there but has not made that appt yet  At the hematology office, they did labs and more appts, that's where she got frustrated and then decided to see another specialist; she cancelled her upcoming appt; she just wasn't getting answers; I asked what they were worried about; she says her low platelet count; they put her on folic acid; they thought about too much iron, but high iron does not run in the family; she is tired of blood draws and wants to wait for the specialist (when I offered to get labs here today)  Hypothyroidism; managed here Lab Results  Component Value Date   TSH 1.219 07/05/2015   The main reason for her visit today is ADHD; diagnosed years ago; doesn't really remember how many years ago; saw therapist a while back and thought it was helpful; does not think therapy needed now; her ADHD causes her to have trouble focusing and staying on track; with the medicine, she can finish a task before starting another thing; no fast heart rate, no palpitations, no sweating, no headaches; does not feel jittery or shaky; weight is stable; good eater; no problems with appetite suppression  Labs reviewed with her from hematology; her MCV is quite high; she says she continues to drink; vodka is  her drink of choice; she is down to two drinks a day  Depression screen [facility] 2/9 12/13/2015 08/14/2015 04/05/2015  Decreased Interest 0 0 0  Down, Depressed, Hopeless 0 0 0  PHQ - 2 Score 0 0 0   Relevant past medical, surgical, family and social history reviewed Past Medical History:  Diagnosis Date  . ADD (attention deficit disorder)   . Alcohol abuse   . 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 Cirrhosis (HCC)   . Depression   . Thyroid disease    Past Surgical History:  Procedure Laterality Date  . ABDOMINAL HYSTERECTOMY    . APPENDECTOMY    . CHOLECYSTECTOMY     Family History  Problem Relation Age of Onset  . Cancer Father   . Hypertension Father   MD: suspects ADD in the family  Social History  Substance Use Topics  . Smoking status: Current Every Day Smoker    Packs/day: 1.00    Types: Cigarettes  . Smokeless tobacco: Not on file  . Alcohol use 0.0 oz/week     Comment: excessively  MD note: drinking vodka but she says she has cut back  Interim medical history since last visit reviewed. Allergies and medications reviewed  Review of Systems  Constitutional: Negative for unexpected weight change.  Cardiovascular: Negative for palpitations.  Gastrointestinal: Positive  for nausea (a few days ago, had N/V/D).  Musculoskeletal: Positive for arthralgias.  Psychiatric/Behavioral: The patient is nervous/anxious.   Per HPI unless specifically indicated above     Objective:    BP 110/64   Pulse (!) 105   Temp 98.2 F (36.8 C) (Oral)   Resp 16   Wt 110 lb (49.9 kg)   SpO2 98%   BMI 18.88 kg/m   Wt Readings from Last 3 Encounters:  12/13/15 110 lb (49.9 kg)  11/08/15 108 lb 5.7 oz (49.2 kg)  09/12/15 112 lb (50.8 kg)    Physical Exam  Constitutional: She appears well-developed and well-nourished.  Thin female, no distress  Eyes: EOM are normal.  Cardiovascular: Normal rate and regular rhythm.   No extrasystoles are present.  Heart rate just  under 100 during auscultation  Pulmonary/Chest: Effort normal and breath sounds normal.  Abdominal: Soft. There is no hepatomegaly. There is tenderness (mild). There is no guarding.  Neurological: She displays no tremor.  Reflex Scores:      Patellar reflexes are 1+ on the right side and 1+ on the left side. No tics  Skin: No pallor.  Psychiatric: Her behavior is normal. Judgment and thought content normal. Her mood appears anxious. Her speech is not rapid and/or pressured. Cognition and memory are normal. She does not exhibit a depressed mood.  When discussing the NCCSRS webiste report with her, she became upset; stood up; refused to take her check-out/after visit summary and walked out   Results for orders placed or performed in visit on 11/08/15  CBC with Differential/Platelet  Result Value Ref Range   WBC 3.7 3.6 - 11.0 K/uL   RBC 3.81 3.80 - 5.20 MIL/uL   Hemoglobin 13.6 12.0 - 16.0 g/dL   HCT 64.3 32.9 - 51.8 %   MCV 105.9 (H) 80.0 - 100.0 fL   MCH 35.7 (H) 26.0 - 34.0 pg   MCHC 33.7 32.0 - 36.0 g/dL   RDW 84.1 66.0 - 63.0 %   Platelets 94 (L) 150 - 440 K/uL   Neutrophils Relative % 51% %   Neutro Abs 1.9 1.4 - 6.5 K/uL   Lymphocytes Relative 39% %   Lymphs Abs 1.4 1.0 - 3.6 K/uL   Monocytes Relative 7% %   Monocytes Absolute 0.3 0.2 - 0.9 K/uL   Eosinophils Relative 2% %   Eosinophils Absolute 0.1 0 - 0.7 K/uL   Basophils Relative 1% %   Basophils Absolute 0.0 0 - 0.1 K/uL  Folate  Result Value Ref Range   Folate 7.6 >5.9 ng/mL      Assessment & Plan:   Problem List Items Addressed This Visit      Endocrine   Hypothyroidism (acquired)    Reviewed last TSH; weight is stable, normal lab; no change to medicine        Nervous and Auditory   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)    I congratulated patient on cutting down on her alcohol; I offered her a list of rehab centers, help if desired to stop drinking; she declined        Other    Macrocytosis    Patient was not interested in following up with hematologist initially; however, I reviewed her abnormal labs with her and let her know that her work-up is not done and that Dr. Merlene Pulling is still wanting to work with her and check more things out, including her iron levels; by the end of the discussion, patient  sounded as if she would indeed call Dr. Danton Sewerorcoran's office today and follow-up with her      Folate deficiency    It appears that Dr. Merlene Pullingorcoran increased her folic acid from 1 mg a day to 2 mg a day and then patient was due for labs a few days ago; I am not sure that she meant for patient to stay on folic acid 2 mg indefinitely, so I will not update that part of the med list, but leave that to hematology; patient was shown the instructions to have increased folic acid to 2mg  daily and then recheck labs 1 month later (which would have been a few days ago)      Controlled substance agreement signed    NCCSRS website pulled up and reviewed in the patient's presence; it shows that she had a fill of tramadol on August 4th; we talked about this; I pulled up her controlled substance contract and explained that violates our agreement and I won't be prescribing her any more controlled substances; I will be glad to refer her to a psychiatrist, and put that referral in for her while she was here; I also told her about [facility] as a resource for psychiatric services and explained that they have a walk-in crisis service on Mon-Wed-Fri from 8 to 3 pm (today is Wednesday); I provided her the address and phone number and showed her a map; she was upset that she would not be getting her prescription and said she would not reschedule her and walked out without taking her after visit summary      Chronic knee pain    Patient reports that she is going to see orthopaedist soon      Chronic elbow pain    Patient reports that she is going to see orthopaedist soon      ADHD (attention deficit  hyperactivity disorder), combined type    Refer to psych; I explained that I will not be prescribing her controlled substance any longer since the fill of tramadol violated our controlled substance agreement; I recommended [place] (only handle it once), try to limit number of outstanding things to do, suggested counseling/therapy if life becomes stressful, etc.; she will not be following up here she said      Relevant Orders   Ambulatory referral to Psychiatry    Other Visit Diagnoses   None.      Follow up plan: No Follow-up on file.  An after-visit summary was printed and given to the patient at check-out.  Please see the patient instructions which may contain other information and recommendations beyond what is mentioned above in the assessment and plan.  Orders Placed This Encounter  Procedures  . Ambulatory referral to Psychiatry

## 2015-12-13 NOTE — Assessment & Plan Note (Signed)
It appears that Dr. Merlene Pullingorcoran increased her folic acid from 1 mg a day to 2 mg a day and then patient was due for labs a few days ago; I am not sure that she meant for patient to stay on folic acid 2 mg indefinitely, so I will not update that part of the med list, but leave that to hematology; patient was shown the instructions to have increased folic acid to 2mg  daily and then recheck labs 1 month later (which would have been a few days ago)

## 2015-12-13 NOTE — Patient Instructions (Addendum)
I do encourage you to see a hematologist, whether here or [facility], just let us know if we can help Dr. Merlene Pullingorcoran has many outstanding tests that she wants done so please don't let that fall through the cracks  I've put in the referral to psychiatrist If you have not heard anything from my staff in a week about any orders/referrals/studies from today, please contact us here to follow-up [phone]

## 2015-12-13 NOTE — Assessment & Plan Note (Signed)
Patient reports that she is going to see orthopaedist soon

## 2015-12-13 NOTE — Assessment & Plan Note (Signed)
Reviewed last TSH; weight is stable, normal lab; no change to medicine

## 2015-12-13 NOTE — Assessment & Plan Note (Signed)
Patient was not interested in following up with hematologist initially; however, I reviewed her abnormal labs with her and let her know that her work-up is not done and that Dr. Merlene Pullingorcoran is still wanting to work with her and check more things out, including her iron levels; by the end of the discussion, patient sounded as if she would indeed call Dr. Danton Sewerorcoran's office today and follow-up with her

## 2015-12-13 NOTE — Assessment & Plan Note (Signed)
I congratulated patient on cutting down on her alcohol; I offered her a list of rehab centers, help if desired to stop drinking; she declined

## 2015-12-13 NOTE — Assessment & Plan Note (Signed)
Patient reports that she is going to see orthopaedist soon 

## 2015-12-13 NOTE — Assessment & Plan Note (Signed)
NCCSRS website pulled up and reviewed in the patient's presence; it shows that she had a fill of tramadol on August 4th; we talked about this; I pulled up her controlled substance contract and explained that violates our agreement and I won't be prescribing her any more controlled substances; I will be glad to refer her to a psychiatrist, and put that referral in for her while she was here; I also told her about [facility] as a resource for psychiatric services and explained that they have a walk-in crisis service on Mon-Wed-Fri from 8 to 3 pm (today is Wednesday); I provided her the address and phone number and showed her a map; she was upset that she would not be getting her prescription and said she would not reschedule her and walked out without taking her after visit summary

## 2015-12-15 NOTE — Telephone Encounter (Signed)
  What is the status of this?  M

## 2015-12-18 NOTE — Telephone Encounter (Signed)
Called this am and spoke to pt's mother and pt not awake yet and will have her call later today

## 2015-12-19 ENCOUNTER — Other Ambulatory Visit: Payer: Self-pay | Admitting: *Deleted

## 2015-12-19 ENCOUNTER — Inpatient Hospital Stay: Payer: Medicaid Other | Attending: Hematology and Oncology

## 2015-12-19 ENCOUNTER — Telehealth: Payer: Self-pay | Admitting: *Deleted

## 2015-12-19 ENCOUNTER — Inpatient Hospital Stay: Payer: Medicaid Other

## 2015-12-19 DIAGNOSIS — E538 Deficiency of other specified B group vitamins: Secondary | ICD-10-CM | POA: Diagnosis not present

## 2015-12-19 DIAGNOSIS — D696 Thrombocytopenia, unspecified: Secondary | ICD-10-CM | POA: Diagnosis present

## 2015-12-19 DIAGNOSIS — D693 Immune thrombocytopenic purpura: Secondary | ICD-10-CM | POA: Insufficient documentation

## 2015-12-19 LAB — CBC WITH DIFFERENTIAL/PLATELET
Basophils Absolute: 0 10*3/uL (ref 0–0.1)
Basophils Relative: 0 %
Eosinophils Absolute: 0 10*3/uL (ref 0–0.7)
Eosinophils Relative: 1 %
HCT: 37.6 % (ref 35.0–47.0)
Hemoglobin: 12.8 g/dL (ref 12.0–16.0)
Lymphocytes Relative: 43 %
Lymphs Abs: 1.9 10*3/uL (ref 1.0–3.6)
MCH: 35.4 pg — ABNORMAL HIGH (ref 26.0–34.0)
MCHC: 34.1 g/dL (ref 32.0–36.0)
MCV: 103.9 fL — ABNORMAL HIGH (ref 80.0–100.0)
Monocytes Absolute: 0.3 10*3/uL (ref 0.2–0.9)
Monocytes Relative: 7 %
Neutro Abs: 2.1 10*3/uL (ref 1.4–6.5)
Neutrophils Relative %: 49 %
Platelets: 132 10*3/uL — ABNORMAL LOW (ref 150–440)
RBC: 3.62 MIL/uL — ABNORMAL LOW (ref 3.80–5.20)
RDW: 13.2 % (ref 11.5–14.5)
WBC: 4.4 10*3/uL (ref 3.6–11.0)

## 2015-12-19 LAB — VITAMIN B12: Vitamin B-12: 390 pg/mL (ref 180–914)

## 2015-12-19 LAB — FERRITIN: Ferritin: 445 ng/mL — ABNORMAL HIGH (ref 11–307)

## 2015-12-19 LAB — SEDIMENTATION RATE: Sed Rate: 4 mm/hr (ref 0–20)

## 2015-12-19 LAB — IRON AND TIBC
Iron: 86 ug/dL (ref 28–170)
Saturation Ratios: 26 % (ref 10.4–31.8)
TIBC: 330 ug/dL (ref 250–450)
UIBC: 244 ug/dL

## 2015-12-19 LAB — FOLATE: Folate: 6.3 ng/mL (ref >5.9)

## 2015-12-19 NOTE — Telephone Encounter (Signed)
-----   Message from Synetta Shadowolette C Dixon sent at 12/19/2015  4:40 PM EDT ----- 8/21 @ 9:15 Thanks  ----- Message ----- From: Corene CorneaSharon Y Yesmin Mutch, RN Sent: 12/19/2015   4:35 PM To: Synetta Shadowolette C Dixon  Can you r/s her u/s that she did not go to.  She woul dlike [place] for 8/21 or 8/22.  Let me know and I will call her.

## 2015-12-19 NOTE — Telephone Encounter (Signed)
Spoke to pt several times today.  She needed to have u/s r/s and the new date per pt request was 8/21 but then when I called pt. Back she states with the death in the family now that date is not good for her because of funeral arrangements.  She can do it anytime 8/23 or after in [place] . I will send another message to colette to aske her to r/s it.  I have clarified that the pt does not need b12 inj.  Her b12 level was in 300's and when MD ordered MMA if it is normal level then she is not b12 def. If it was elevated she would be and her levels were normal and I feel where the mistake was made is that the last thing the md ordered was b12 and she did have that level drawn but the sch. Person took it as b12 inj.  And also when the md note was dictated it should have said rulling our for b12 def. And instead it said rulling a b12 def.  So that was type error and will be amended.  I will enter a bone marrow bx for pt for poss. 8/30.  I will call pt when this is arranged and I did tell her that we are getting new CT scanner and at this time the BM bx will need to be done in [place] and she will need a ride after procedure because she would like to be knocked out as she called it. She is agreeable to the above plan.   After reviewing pt's labs per corcoran the plt have almost came back to normal and she does not feel a bone marrow bx is needed at this time.  Pt being worked up for orthopedics for her bone aches, and joint aches and she had not got a rheumatology appt yet. But is working on it.  She has mixed feelings about not having BM bx because she just wants answers as to why she hurts all the time.  I told her if it is autoimmune disorder that typically a BM bx does not show that kind of disorder and with her counts coming back on its own it just does not warrant a bx.  She is ok with this.  I told her we will still r/s her u/s and she was ok with that and I asked if she knew when her next appt is and  she said she is not worried about it because of the death of her family member.  I told her that I would get colette to mail her a new appt and call her with u/s r/x appt and she is ok with that.

## 2015-12-20 ENCOUNTER — Ambulatory Visit: Payer: Self-pay

## 2015-12-20 ENCOUNTER — Other Ambulatory Visit: Payer: Self-pay

## 2015-12-25 ENCOUNTER — Ambulatory Visit: Payer: Medicaid Other

## 2015-12-29 ENCOUNTER — Ambulatory Visit
Admission: EM | Admit: 2015-12-29 | Discharge: 2015-12-29 | Disposition: A | Discharge status: Home / Self Care | Payer: Medicaid Other | Attending: Family Medicine | Admitting: Family Medicine

## 2015-12-29 ENCOUNTER — Encounter: Payer: Self-pay | Admitting: *Deleted

## 2015-12-29 DIAGNOSIS — F902 Attention-deficit hyperactivity disorder, combined type: Secondary | ICD-10-CM | POA: Insufficient documentation

## 2015-12-29 DIAGNOSIS — N3000 Acute cystitis without hematuria: Secondary | ICD-10-CM | POA: Diagnosis not present

## 2015-12-29 DIAGNOSIS — F17210 Nicotine dependence, cigarettes, uncomplicated: Secondary | ICD-10-CM | POA: Diagnosis not present

## 2015-12-29 DIAGNOSIS — K7460 Unspecified cirrhosis of liver: Secondary | ICD-10-CM | POA: Insufficient documentation

## 2015-12-29 DIAGNOSIS — R300 Dysuria: Secondary | ICD-10-CM | POA: Insufficient documentation

## 2015-12-29 DIAGNOSIS — R350 Frequency of micturition: Secondary | ICD-10-CM | POA: Diagnosis present

## 2015-12-29 DIAGNOSIS — F1010 Alcohol abuse, uncomplicated: Secondary | ICD-10-CM | POA: Diagnosis not present

## 2015-12-29 DIAGNOSIS — Z880 Allergy status to penicillin: Secondary | ICD-10-CM | POA: Insufficient documentation

## 2015-12-29 DIAGNOSIS — F329 Major depressive disorder, single episode, unspecified: Secondary | ICD-10-CM | POA: Insufficient documentation

## 2015-12-29 LAB — URINALYSIS COMPLETE WITH MICROSCOPIC (ARMC ONLY)
Bacteria, UA: NONE SEEN
Bilirubin Urine: NEGATIVE
Glucose, UA: NEGATIVE mg/dL
Hgb urine dipstick: NEGATIVE
KETONES UR: NEGATIVE mg/dL
LEUKOCYTES UA: NEGATIVE
NITRITE: NEGATIVE
PROTEIN: NEGATIVE mg/dL
SPECIFIC GRAVITY, URINE: 1.02 (ref 1.005–1.030)
WBC UA: NONE SEEN WBC/hpf (ref 0–5)
pH: 5.5 (ref 5.0–8.0)

## 2015-12-29 MED ORDER — NITROFURANTOIN MONOHYD MACRO 100 MG PO CAPS: 100.0000 mg | ORAL_CAPSULE | Freq: Two times a day (BID) | ORAL | 0 refills | Status: DC

## 2015-12-29 MED ORDER — PHENAZOPYRIDINE HCL 200 MG PO TABS: 200.0000 mg | ORAL_TABLET | Freq: Three times a day (TID) | ORAL | 0 refills | Status: DC | PRN

## 2015-12-29 NOTE — ED Provider Notes (Signed)
[facility]    CSN: [id number] Arrival date & time: 12/29/15  1220  First Provider Contact:  First MD Initiated Contact with Patient 12/29/15 1300        History   Chief Complaint Chief Complaint  Patient presents with  . Dysuria  . Urinary Urgency  . Urinary Frequency  . Back Pain    HPI [name] is a 35 y.o. female.   Patient's here because of which she thinks UTI. She reports burning urination last night she's had a urine without older for a while. She's had a hysterectomy appendectomy and cholecystectomy. She states that sometimes she has difficulty voiding. She denies any dyspareunia vaginal discharge. History of cirrhosis alcohol abuse chronic benzo diazepam usage. She states she's been recently diagnosed with some type of connective tissue disease but she is not sure what it is. His looks penicillin she does smoke. She is allergic to penicillin. Robert cancer and hypertension.   The history is provided by the patient. No language interpreter was used.  Dysuria  Pain quality:  Aching and burning Pain severity:  Moderate Onset quality:  Sudden Timing:  Constant Progression:  Worsening Chronicity:  New Recent urinary tract infections: no   Relieved by:  Nothing Worsened by:  Nothing Ineffective treatments:  None tried Urinary symptoms: foul-smelling urine, frequent urination and hesitancy   Associated symptoms: no abdominal pain, no fever, no flank pain, no genital lesions, no nausea and no vaginal discharge   Risk factors: recurrent urinary tract infections   Risk factors: no hx of pyelonephritis, no hx of urolithiasis, no kidney transplant, not pregnant, no renal disease, not sexually active, no sexually transmitted infections and no single kidney   Urinary Frequency  Pertinent negatives include no abdominal pain.  Back Pain  Associated symptoms: dysuria   Associated symptoms: no abdominal pain and no fever     Past Medical History:  Diagnosis  Date  . ADD (attention deficit disorder)   . Alcohol abuse   . 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 Cirrhosis (HCC)   . Depression   . Thyroid disease     Patient Active Problem List   Diagnosis Date Noted  . 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 Controlled substance agreement signed 08/14/2015  . Thrombocytopenia (HCC) 07/05/2015  . Folate deficiency 07/05/2015  . Macrocytosis 06/05/2015  . Chronic right shoulder pain 05/24/2015  . Chronic elbow pain 05/24/2015  . Chronic knee pain 05/24/2015  . 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 Hypothyroidism (acquired) 04/05/2015  . GERD without esophagitis 04/05/2015  . Allergic rhinitis 04/05/2015  . Situational anxiety 04/05/2015  . 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 Liver cirrhosis (HCC) 04/05/2015  . Left lower quadrant pain 04/05/2015  . History of ovarian cyst 04/05/2015  . ADHD (attention deficit hyperactivity disorder), combined type 04/05/2015    Past Surgical History:  Procedure Laterality Date  . ABDOMINAL HYSTERECTOMY    . APPENDECTOMY    . CHOLECYSTECTOMY      OB History    No data available       Home Medications    Prior to Admission medications   Medication Sig Start Date End Date Taking? Authorizing Provider  dextroamphetamine (DEXTROSTAT) 10 MG tablet Take 1.5 tablets (15 mg total) by mouth 2 (two) times daily. 09/12/15  Yes Kerman Passey, MD  diclofenac sodium (VOLTAREN) 1 % GEL Apply 2 g topically daily as needed.   Yes Historical Provider, MD  folic acid (FOLVITE)  1 MG tablet Take 1 tablet (1 mg total) by mouth daily. 07/12/15  Yes Rosey Bath, MD  levothyroxine (SYNTHROID, LEVOTHROID) 25 MCG tablet Take 25 mcg by mouth daily.   Yes Historical Provider, MD  fluticasone (FLONASE) 50 MCG/ACT nasal spray Place 2 sprays into both nostrils daily. 08/14/15   Kerman Passey, MD  nitrofurantoin, macrocrystal-monohydrate, (MACROBID) 100 MG capsule  Take 1 capsule (100 mg total) by mouth 2 (two) times daily. 12/29/15   Hassan Rowan, MD  phenazopyridine (PYRIDIUM) 200 MG tablet Take 1 tablet (200 mg total) by mouth 3 (three) times daily as needed for pain. 12/29/15   Hassan Rowan, MD    Family History Family History  Problem Relation Age of Onset  . Cancer Father   . Hypertension Father     Social History Social History  Substance Use Topics  . Smoking status: Current Every Day Smoker    Packs/day: 1.00    Types: Cigarettes  . Smokeless tobacco: Never Used  . Alcohol use 0.0 oz/week     Comment: excessively     Allergies   Penicillins   Review of Systems Review of Systems  Constitutional: Negative for fever.  Gastrointestinal: Negative for abdominal pain and nausea.  Genitourinary: Positive for dysuria and frequency. Negative for flank pain and vaginal discharge.  Musculoskeletal: Positive for back pain.  All other systems reviewed and are negative.    Physical Exam Triage Vital Signs ED Triage Vitals  Enc Vitals Group     BP --      Pulse Rate 12/29/15 1244 90     Resp 12/29/15 1244 16     Temp 12/29/15 1244 98 F (36.7 C)     Temp Source 12/29/15 1244 Oral     SpO2 12/29/15 1244 99 %     Weight 12/29/15 1245 107 lb (48.5 kg)     Height 12/29/15 1245 5\' 1"  (1.549 m)     Head Circumference --      Peak Flow --      Pain Score --      Pain Loc --      Pain Edu? --      Excl. in GC? --    No data found.   Updated Vital Signs Pulse 90   Temp 98 F (36.7 C) (Oral)   Resp 16   Ht 5\' 1"  (1.549 m)   Wt 107 lb (48.5 kg)   SpO2 99%   BMI 20.22 kg/m   Visual Acuity Right Eye Distance:   Left Eye Distance:   Bilateral Distance:    Right Eye Near:   Left Eye Near:    Bilateral Near:     Physical Exam  Constitutional: She appears well-developed and well-nourished.  HENT:  Head: Normocephalic and atraumatic.  Eyes: Pupils are equal, round, and reactive to light.  Neck: Normal range of motion.    Pulmonary/Chest: Effort normal.  Abdominal: Soft. Bowel sounds are normal. She exhibits no distension. There is no tenderness. There is CVA tenderness.  Musculoskeletal: Normal range of motion.  Neurological: She is alert.  Skin: Skin is warm.  Psychiatric: She has a normal mood and affect.  Vitals reviewed.    UC Treatments / Results  Labs (all labs ordered are listed, but only abnormal results are displayed) Labs Reviewed  URINALYSIS COMPLETEWITH MICROSCOPIC ([facility] ONLY) - Abnormal; Notable for the following:       Result Value   Squamous Epithelial / LPF 0-5 (*)  All other components within normal limits  URINE CULTURE    EKG  EKG Interpretation None       Radiology No results found.  Procedures Procedures (including critical care time)  Medications Ordered in UC Medications - No data to display   Initial Impression / Assessment and Plan / UC Course  I have reviewed the triage vital signs and the nursing notes.  Pertinent labs & imaging results that were available during my care of the patient were reviewed by me and considered in my medical decision making (see chart for details).    Results for orders placed or performed during the hospital encounter of 12/29/15  Urinalysis complete, with microscopic  Result Value Ref Range   Color, Urine YELLOW YELLOW   APPearance CLEAR CLEAR   Glucose, UA NEGATIVE NEGATIVE mg/dL   Bilirubin Urine NEGATIVE NEGATIVE   Ketones, ur NEGATIVE NEGATIVE mg/dL   Specific Gravity, Urine 1.020 1.005 - 1.030   Hgb urine dipstick NEGATIVE NEGATIVE   pH 5.5 5.0 - 8.0   Protein, ur NEGATIVE NEGATIVE mg/dL   Nitrite NEGATIVE NEGATIVE   Leukocytes, UA NEGATIVE NEGATIVE   RBC / HPF 0-5 0 - 5 RBC/hpf   WBC, UA NONE SEEN 0 - 5 WBC/hpf   Bacteria, UA NONE SEEN NONE SEEN   Squamous Epithelial / LPF 0-5 (A) NONE SEEN   Mucous PRESENT    Clinical Course    Urine was not that remarkable for history is more impressive. We'll place  her on Macrobid 100 mg twice a day for 5 days since her pH is 5.5. Will also place on Pyridium for 5 days and order urine culture. Will treat from there. We'll have her follow-up with PCP if not better in 1-2 weeks. Will give a work note for today and tomorrow as needed.  Final Clinical Impressions(s) / UC Diagnoses   Final diagnoses:  Dysuria  Acute cystitis without hematuria    New Prescriptions New Prescriptions   NITROFURANTOIN, MACROCRYSTAL-MONOHYDRATE, (MACROBID) 100 MG CAPSULE    Take 1 capsule (100 mg total) by mouth 2 (two) times daily.   PHENAZOPYRIDINE (PYRIDIUM) 200 MG TABLET    Take 1 tablet (200 mg total) by mouth 3 (three) times daily as needed for pain.     Hassan RowanEugene Kolden Dupee, MD 12/29/15 1329

## 2015-12-29 NOTE — ED Triage Notes (Signed)
Dysuria, urinary urgency/frequency, and low back pain x2 days.

## 2015-12-31 LAB — URINE CULTURE
Culture: <10000 — AB
SPECIAL REQUESTS: NORMAL

## 2016-01-01 ENCOUNTER — Encounter: Payer: Self-pay | Admitting: Psychiatry

## 2016-01-01 ENCOUNTER — Ambulatory Visit
Admission: RE | Admit: 2016-01-01 | Discharge: 2016-01-01 | Disposition: A | Discharge status: Home / Self Care | Payer: Medicaid Other | Source: Ambulatory Visit | Attending: Hematology and Oncology | Admitting: Hematology and Oncology

## 2016-01-01 ENCOUNTER — Ambulatory Visit (INDEPENDENT_AMBULATORY_CARE_PROVIDER_SITE_OTHER): Payer: Medicaid Other | Admitting: Psychiatry

## 2016-01-01 VITALS — BP 119/82 | HR 101 | Temp 98.1°F | Ht 61.0 in | Wt 111.2 lb

## 2016-01-01 DIAGNOSIS — F1094 Alcohol use, unspecified with alcohol-induced mood disorder: Secondary | ICD-10-CM | POA: Diagnosis not present

## 2016-01-01 DIAGNOSIS — K7030 Alcoholic cirrhosis of liver without ascites: Secondary | ICD-10-CM

## 2016-01-01 DIAGNOSIS — F1020 Alcohol dependence, uncomplicated: Secondary | ICD-10-CM

## 2016-01-01 DIAGNOSIS — Z9049 Acquired absence of other specified parts of digestive tract: Secondary | ICD-10-CM | POA: Diagnosis not present

## 2016-01-01 DIAGNOSIS — D696 Thrombocytopenia, unspecified: Secondary | ICD-10-CM | POA: Insufficient documentation

## 2016-01-01 NOTE — Progress Notes (Signed)
Psychiatric Initial Adult Assessment   Patient Identification: [name] MRN:  [id number] Date of Evaluation:  01/01/2016 Referral Source: Baruch GoutyMelinda Lada, M.D  Chief Complaint:   Chief Complaint    Establish Care; ADD; Joint Pain     Visit Diagnosis:    ICD-9-CM ICD-10-CM   1. 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 Alcohol use disorder, moderate, dependence (HCC) 303.90 F10.20     History of Present Illness:    Patient is a 35 year old female who presented for initial assessment. She was referred by her primary care physician Dr. Sherie DonLada. She was irate and agitated during the interview. She reported that she needs a prescription for her ADD medication, Dextrostat as she was violated the contract. Patient stated that she was getting the prescription from Dr. Sherie DonLada but now she is not able to refill her prescriptions. Patient stated that she went to the ED and got a prescription of comment on from there. She stated that she continues to have severe pain in her joints including her niece and her elbow. She continues to drink on a regular basis and reported that she consumed X shots yesterday. Patient reported that she has history of cirrhosis of the liver but she is not getting any treatment for the same. Patient reported that everything else is irrelevant. She did not fill out her questionnaire and she came from the appointment and was agitated and irate. She was focused on getting a prescription of the stimulant medication and walked out of the office as she does not want to answer any other questions.  She denied having any suicidal homicidal ideations or plans.  Associated Signs/Symptoms: Depression Symptoms:  fatigue, loss of energy/fatigue, (Hypo) Manic Symptoms:  Flight of Ideas, Anxiety Symptoms:  minimizing Psychotic Symptoms:  denied  PTSD Symptoms: Negative NA  Past Psychiatric History:   She reported that she was getting a prescription of ADD medication from  her primary care physician for the past 10 years.  Previous Psychotropic Medications: Lorazepam Dextrostat    Substance Abuse History in the last 12 months:  Yes.    3 shots - liquor  She denied having any withdrawal symptoms including blackouts or seizures.   Consequences of Substance Abuse: Medical Consequences:  cirrhosis   Past Medical History:  Past Medical History:  Diagnosis Date  . ADD (attention deficit disorder)   . Alcohol abuse   . 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 Cirrhosis (HCC)   . Depression   . Thyroid disease     Past Surgical History:  Procedure Laterality Date  . ABDOMINAL HYSTERECTOMY    . APPENDECTOMY    . CHOLECYSTECTOMY      Family Psychiatric History: Pt  declined  Family History:  Family History  Problem Relation Age of Onset  . Hyperlipidemia Mother   . Cancer Father   . Hypertension Father   . Diabetes Father   . Hyperlipidemia Father     Social History:   Social History   Social History  . Marital status: Married    Spouse name: N/A  . Number of children: N/A  . Years of education: N/A   Social History Main Topics  . Smoking status: Current Every Day Smoker    Packs/day: 1.00    Types: Cigarettes    Start date: 12/31/2000  . Smokeless tobacco: Never Used  . Alcohol use 2.4 oz/week    4 Shots of liquor per week     Comment: excessively  . Drug use:  No  . Sexual activity: Not Currently   Other Topics Concern  . None   Social History Narrative  . None    Additional Social History:  Married x 2  Widowed.   Allergies:   Allergies  Allergen Reactions  . Penicillins Rash    Metabolic Disorder Labs: No results found for: HGBA1C, MPG No results found for: PROLACTIN No results found for: CHOL, TRIG, HDL, CHOLHDL, VLDL, LDLCALC   Current Medications: Current Outpatient Prescriptions  Medication Sig Dispense Refill  . dextroamphetamine (DEXTROSTAT) 10 MG tablet Take 1.5 tablets (15 mg total)  by mouth 2 (two) times daily. 90 tablet 0  . diclofenac sodium (VOLTAREN) 1 % GEL Apply 2 g topically daily as needed.    . folic acid (FOLVITE) 1 MG tablet Take 1 tablet (1 mg total) by mouth daily. 30 tablet 3  . levothyroxine (SYNTHROID, LEVOTHROID) 25 MCG tablet Take 25 mcg by mouth daily.    . nitrofurantoin, macrocrystal-monohydrate, (MACROBID) 100 MG capsule Take 1 capsule (100 mg total) by mouth 2 (two) times daily. 10 capsule 0   No current facility-administered medications for this visit.     Neurologic: Headache: No Seizure: No Paresthesias:No  Musculoskeletal: Strength & Muscle Tone: within normal limits Gait & Station: normal Patient leans: N/A  Psychiatric Specialty Exam: ROS  Blood pressure 119/82, pulse (!) 101, temperature 98.1 F (36.7 C), temperature source Oral, height 5\' 1"  (1.549 m), weight 111 lb 3.2 oz (50.4 kg).Body mass index is 21.01 kg/m.  General Appearance: Casual  Eye Contact:  Fair  Speech:  Pressured  Volume:  Normal  Mood:  Anxious  Affect:  Congruent  Thought Process:  Coherent  Orientation:  Full (Time, Place, and Person)  Thought Content:  Logical  Suicidal Thoughts:  No  Homicidal Thoughts:  No  Memory:  Immediate;   Fair Recent;   Fair Remote;   Fair  Judgement:  Impaired  Insight:  Lacking  Psychomotor Activity:  Normal  Concentration:  Concentration: Fair and Attention Span: Fair  Recall:  [employer] of Knowledge:Fair  Language: Fair  Akathisia:  No  Handed:  Right  AIMS (if indicated):    Assets:  Communication Skills Housing  ADL's:  Intact  Cognition: WNL  Sleep:      Treatment Plan Summary: Medication management   Discussed with patient that she needs to be tested for ADD at this time She became irate and left the office without further discussion.  No medications are dispensed to the patient  She did not make any follow-up appointment   More than 50% of the time spent in psychoeducation, counseling and  coordination of care.    This note was generated in part or whole with voice recognition software. Voice regonition is usually quite accurate but there are transcription errors that can and very often do occur. I apologize for any typographical errors that were not detected and corrected.    Brandy Hale, MD 8/28/20179:43 AM

## 2016-01-10 ENCOUNTER — Other Ambulatory Visit: Payer: Medicaid Other

## 2016-01-10 ENCOUNTER — Ambulatory Visit: Payer: Medicaid Other | Admitting: Hematology and Oncology

## 2016-01-10 ENCOUNTER — Inpatient Hospital Stay: Payer: Medicaid Other | Attending: Hematology and Oncology

## 2016-01-10 ENCOUNTER — Inpatient Hospital Stay (HOSPITAL_BASED_OUTPATIENT_CLINIC_OR_DEPARTMENT_OTHER): Payer: Medicaid Other | Admitting: Hematology and Oncology

## 2016-01-10 VITALS — BP 128/84 | HR 86 | Temp 98.2°F | Resp 18 | Wt 116.0 lb

## 2016-01-10 DIAGNOSIS — E538 Deficiency of other specified B group vitamins: Secondary | ICD-10-CM

## 2016-01-10 DIAGNOSIS — D693 Immune thrombocytopenic purpura: Secondary | ICD-10-CM

## 2016-01-10 DIAGNOSIS — Z880 Allergy status to penicillin: Secondary | ICD-10-CM | POA: Insufficient documentation

## 2016-01-10 DIAGNOSIS — E079 Disorder of thyroid, unspecified: Secondary | ICD-10-CM | POA: Diagnosis not present

## 2016-01-10 DIAGNOSIS — D696 Thrombocytopenia, unspecified: Secondary | ICD-10-CM | POA: Diagnosis not present

## 2016-01-10 DIAGNOSIS — F1010 Alcohol abuse, uncomplicated: Secondary | ICD-10-CM

## 2016-01-10 DIAGNOSIS — K7030 Alcoholic cirrhosis of liver without ascites: Secondary | ICD-10-CM

## 2016-01-10 DIAGNOSIS — F17210 Nicotine dependence, cigarettes, uncomplicated: Secondary | ICD-10-CM | POA: Diagnosis not present

## 2016-01-10 DIAGNOSIS — Z79899 Other long term (current) drug therapy: Secondary | ICD-10-CM | POA: Insufficient documentation

## 2016-01-10 DIAGNOSIS — M1990 Unspecified osteoarthritis, unspecified site: Secondary | ICD-10-CM | POA: Diagnosis not present

## 2016-01-10 LAB — CBC WITH DIFFERENTIAL/PLATELET
Basophils Absolute: 0 10*3/uL (ref 0–0.1)
Basophils Relative: 1 %
Eosinophils Absolute: 0 10*3/uL (ref 0–0.7)
Eosinophils Relative: 1 %
HCT: 37.1 % (ref 35.0–47.0)
Hemoglobin: 12.6 g/dL (ref 12.0–16.0)
Lymphocytes Relative: 41 %
Lymphs Abs: 1 10*3/uL (ref 1.0–3.6)
MCH: 35.2 pg — ABNORMAL HIGH (ref 26.0–34.0)
MCHC: 33.9 g/dL (ref 32.0–36.0)
MCV: 103.8 fL — ABNORMAL HIGH (ref 80.0–100.0)
Monocytes Absolute: 0.2 10*3/uL (ref 0.2–0.9)
Monocytes Relative: 8 %
Neutro Abs: 1.2 10*3/uL — ABNORMAL LOW (ref 1.4–6.5)
Neutrophils Relative %: 49 %
Platelets: 55 10*3/uL — ABNORMAL LOW (ref 150–440)
RBC: 3.57 MIL/uL — ABNORMAL LOW (ref 3.80–5.20)
RDW: 13.3 % (ref 11.5–14.5)
WBC: 2.5 10*3/uL — ABNORMAL LOW (ref 3.6–11.0)

## 2016-01-10 NOTE — Progress Notes (Signed)
[facility]-  Cancer Center  Clinic day:  01/10/16   Chief Complaint: [name] is a 35 y.o. 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  HPI:  The patient was last seen in the medical oncology clinic on 11/08/2015.  At that time, she noted joint pain.  Platelet count was 94,000.  Folate was 7.6.  I encouraged her to stop drinking.  Abdominal ultrasound was rescheduled.  Abdominal ultrasound on 01/01/2016 revealed hepatic cirrhosis.  There was no focal hepatic abnormality identified. Portal vein was patent with normal directional flow.  There was no splenomegaly.  Symptomatically, she notes joint pain (elbow and knee).  She is on no new medications or herbal products.  She saw othopedics.  Xrays were negative.  She is drinking 1/2 pint a day (cut back).  She is taking her folic acid.   Past Medical History:  Diagnosis Date  . ADD (attention deficit disorder)   . Alcohol abuse   . 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 Cirrhosis (HCC)   . Depression   . Thyroid disease     Past Surgical History:  Procedure Laterality Date  . ABDOMINAL HYSTERECTOMY    . APPENDECTOMY    . CHOLECYSTECTOMY      Family History  Problem Relation Age of Onset  . Hyperlipidemia Mother   . Cancer Father   . Hypertension Father   . Diabetes Father   . Hyperlipidemia Father     Social History:  reports that she has been smoking Cigarettes.  She started smoking about 15 years ago. She has been smoking about 1.00 pack per day. She has never used smokeless tobacco. She reports that she drinks about 2.4 oz of alcohol per week . She reports that she does not use drugs.  She has 2 children (age 37 and 8116).  She has a part time job.  The patient has lived in [place] then [place] and is now back in [place].  The patient is alone today.  Allergies:  Allergies  Allergen Reactions  . Penicillins Rash     Current Medications: Current Outpatient Prescriptions  Medication Sig Dispense Refill  . dextroamphetamine (DEXTROSTAT) 10 MG tablet Take 1.5 tablets (15 mg total) by mouth 2 (two) times daily. 90 tablet 0  . diclofenac sodium (VOLTAREN) 1 % GEL Apply 2 g topically daily as needed.    . folic acid (FOLVITE) 1 MG tablet Take 1 tablet (1 mg total) by mouth daily. 30 tablet 3  . levothyroxine (SYNTHROID, LEVOTHROID) 25 MCG tablet Take 25 mcg by mouth daily.    . nitrofurantoin, macrocrystal-monohydrate, (MACROBID) 100 MG capsule Take 1 capsule (100 mg total) by mouth 2 (two) times daily. 10 capsule 0   No current facility-administered medications for this visit.     Review of Systems:  GENERAL:  Feels "ok".  No fevers, sweats.  Weight up 7 pounds. PERFORMANCE STATUS (ECOG):  1 HEENT:  No visual changes, runny nose, sore throat, mouth sores or tenderness. Lungs:  No shortness of breath or cough.  No hemoptysis. Cardiac:  No chest pain, palpitations, orthopnea, or PND. GI:  Occasional nausea.  No vomiting, diarrhea, constipation, melena or hematochezia. GU:  No urgency, frequency, dysuria, or hematuria. Musculoskeletal:  Elbow and knee pain.  No back pain.  No muscle tenderness. Extremities:  Hair thin.  No pain or swelling. Skin:  No rashes or skin changes. Neuro:  No headache,  numbness or weakness, balance or coordination issues. Endocrine:  Thyroid disease on Synthroid.  No diabetes, hot flashes or night sweats. Psych:  No mood changes, depression or anxiety. Pain:  No focal pain. Review of systems:  All other systems reviewed and found to be negative.  Physical Exam: Blood pressure 128/84, pulse 86, temperature 98.2 F (36.8 C), temperature source Tympanic, resp. rate 18, weight 115 lb 15.4 oz (52.6 kg). GENERAL:  Well developed, well nourished, woman sitting comfortably in the exam room in no acute distress. MENTAL STATUS:  Alert and oriented to person, place and time. HEAD:   Blonde hair.  Facial erythema.  Normocephalic, atraumatic, face symmetric, no Cushingoid features. EYES:  Blue eyes.  Pupils equal round and reactive to light and accomodation. No conjunctivitis or scleral icterus. ENT: Oropharynx clear without lesion. Upper dentures. Tongue normal. Mucous membranes moist. Left nares pierced. RESPIRATORY: Clear to auscultation without rales, wheezes or rhonchi. CARDIOVASCULAR: Regular rate and rhythm without murmur, rub or gallop. ABDOMEN: Soft, non-tender, with active bowel sounds, and no hepatomegaly. Spleen tip palpable on deep inspiration. No masses. SKIN: Tattoos on arms. No rashes, ulcers or lesions. EXTREMITIES: No edema, no skin discoloration or tenderness. No palpable cords. LYMPH NODES: No palpable cervical, supraclavicular, axillary or inguinal adenopathy  NEUROLOGICAL: Unremarkable. PSYCH: Appropriate.   Appointment on 01/10/2016  Component Date Value Ref Range Status  . WBC 01/10/2016 2.5* 3.6 - 11.0 K/uL Final  . RBC 01/10/2016 3.57* 3.80 - 5.20 MIL/uL Final  . Hemoglobin 01/10/2016 12.6  12.0 - 16.0 g/dL Final  . HCT 16/02/9603 37.1  35.0 - 47.0 % Final  . MCV 01/10/2016 103.8* 80.0 - 100.0 fL Final  . MCH 01/10/2016 35.2* 26.0 - 34.0 pg Final  . MCHC 01/10/2016 33.9  32.0 - 36.0 g/dL Final  . RDW 54/01/8118 13.3  11.5 - 14.5 % Final  . Platelets 01/10/2016 55* 150 - 440 K/uL Final  . Neutrophils Relative % 01/10/2016 49%  % Final  . Neutro Abs 01/10/2016 1.2* 1.4 - 6.5 K/uL Final  . Lymphocytes Relative 01/10/2016 41%  % Final  . Lymphs Abs 01/10/2016 1.0  1.0 - 3.6 K/uL Final  . Monocytes Relative 01/10/2016 8%  % Final  . Monocytes Absolute 01/10/2016 0.2  0.2 - 0.9 K/uL Final  . Eosinophils Relative 01/10/2016 1%  % Final  . Eosinophils Absolute 01/10/2016 0.0  0 - 0.7 K/uL Final  . Basophils Relative 01/10/2016 1%  % Final  . Basophils Absolute 01/10/2016 0.0  0 - 0.1 K/uL Final    Assessment:  [name] is a  35 y.o. 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 Platelet count has ranged between 106,000 - 113,00. She is folate deficient.  She drinks alcohol (1/2 -1 pint a day) for the past 6 years. She has a history of liver cirrhosis for 4-5 years. AFP was normal on 07/05/2015.  She has no history of splenomegaly. Diet is modest. She denies any new medications or herbal products.  Work-up on 06/05/2015 revealed the following normal labs: SPEP, immunoglobulins (IgG, IgA, and IgM), hepatitis B testing, hepatitis C testing, C-ANCA, P-ANCA, beta2-glocoprotein antibodies, anti-cardiolipin antibodies, lupus anticoagulant testing, C3, C4 , and LDH.  ANA was positive on 05/24/2015.  Work-up on 07/05/2015 confirmed folate deficiency.  B12 was low normal.  MMA was 53 (normal), thus ruling a B12 deficiency.  She has macrocytic RBC indices secondary to liver disease.  Platelet count in a blue top tube was 96,000.  Normal labs included the following:  PT/INR, PTT, TSH, and free T4.  HIV testing was negative.  Abdominal ultrasound on 01/01/2016 revealed hepatic cirrhosis.  There was no focal hepatic abnormality.  There was no splenomegaly.  Symptomatically, she has chronic arthralgias. Exam is stable.  Plan: 1.  Review abdominal ultrasound. 2.  Continue folic acid. 3.  Decrease alcohol. 4.  Anticipate every 6 month abdominal ultrasound and AFP. 5.  RTC in 1 week for labs (CBC with diff, CMP, folate, B12). 6.  RTC in 4 weeks for MD assess and labs (CBC +/- others).    Rosey Bath, MD  01/10/2016, 3:23 PM

## 2016-01-10 NOTE — Progress Notes (Signed)
Patient here for US and lab results. 

## 2016-01-12 ENCOUNTER — Other Ambulatory Visit: Payer: Self-pay | Admitting: *Deleted

## 2016-01-12 DIAGNOSIS — D696 Thrombocytopenia, unspecified: Secondary | ICD-10-CM

## 2016-01-12 DIAGNOSIS — E538 Deficiency of other specified B group vitamins: Secondary | ICD-10-CM

## 2016-01-12 DIAGNOSIS — B182 Chronic viral hepatitis C: Secondary | ICD-10-CM

## 2016-01-17 ENCOUNTER — Inpatient Hospital Stay: Payer: Medicaid Other

## 2016-01-17 ENCOUNTER — Other Ambulatory Visit: Payer: Self-pay | Admitting: *Deleted

## 2016-01-17 ENCOUNTER — Telehealth: Payer: Self-pay | Admitting: *Deleted

## 2016-01-17 DIAGNOSIS — D696 Thrombocytopenia, unspecified: Secondary | ICD-10-CM

## 2016-01-17 DIAGNOSIS — E538 Deficiency of other specified B group vitamins: Secondary | ICD-10-CM

## 2016-01-17 DIAGNOSIS — B182 Chronic viral hepatitis C: Secondary | ICD-10-CM

## 2016-01-17 LAB — COMPREHENSIVE METABOLIC PANEL
ALT: 32 U/L (ref 14–54)
AST: 92 U/L — ABNORMAL HIGH (ref 15–41)
Albumin: 4.3 g/dL (ref 3.5–5.0)
Alkaline Phosphatase: 59 U/L (ref 38–126)
Anion gap: 9 (ref 5–15)
BUN: 7 mg/dL (ref 6–20)
CO2: 27 mmol/L (ref 22–32)
Calcium: 8.5 mg/dL — ABNORMAL LOW (ref 8.9–10.3)
Chloride: 101 mmol/L (ref 101–111)
Creatinine, Ser: 0.44 mg/dL (ref 0.44–1.00)
GFR calc Af Amer: >60 mL/min (ref >60)
GFR calc non Af Amer: >60 mL/min (ref >60)
Glucose, Bld: 90 mg/dL (ref 65–99)
Potassium: 3.4 mmol/L — ABNORMAL LOW (ref 3.5–5.1)
Sodium: 137 mmol/L (ref 135–145)
Total Bilirubin: 0.6 mg/dL (ref 0.3–1.2)
Total Protein: 7.2 g/dL (ref 6.5–8.1)

## 2016-01-17 LAB — CBC WITH DIFFERENTIAL/PLATELET
Basophils Absolute: 0 10*3/uL (ref 0–0.1)
Basophils Relative: 0 %
Eosinophils Absolute: 0.1 10*3/uL (ref 0–0.7)
Eosinophils Relative: 2 %
HCT: 39.1 % (ref 35.0–47.0)
Hemoglobin: 13.1 g/dL (ref 12.0–16.0)
Lymphocytes Relative: 49 %
Lymphs Abs: 1.7 10*3/uL (ref 1.0–3.6)
MCH: 35.2 pg — ABNORMAL HIGH (ref 26.0–34.0)
MCHC: 33.5 g/dL (ref 32.0–36.0)
MCV: 105.3 fL — ABNORMAL HIGH (ref 80.0–100.0)
Monocytes Absolute: 0.3 10*3/uL (ref 0.2–0.9)
Monocytes Relative: 8 %
Neutro Abs: 1.4 10*3/uL (ref 1.4–6.5)
Neutrophils Relative %: 41 %
Platelets: 76 10*3/uL — ABNORMAL LOW (ref 150–440)
RBC: 3.71 MIL/uL — ABNORMAL LOW (ref 3.80–5.20)
RDW: 13.8 % (ref 11.5–14.5)
WBC: 3.5 10*3/uL — ABNORMAL LOW (ref 3.6–11.0)

## 2016-01-17 LAB — VITAMIN B12: Vitamin B-12: 367 pg/mL (ref 180–914)

## 2016-01-17 LAB — FOLATE: Folate: 4.2 ng/mL — ABNORMAL LOW (ref >5.9)

## 2016-01-17 NOTE — Telephone Encounter (Signed)
Called patient to inform her that she should be taking 3 mg Folic Acid daily for the next month and then follow up with another lab to be sure she is at a therapeutic level.  Her next appointment is 02-07-16 which is at 3 weeks but that is fine per Dr. Salena Saner.  Patient verbalized understanding.

## 2016-01-17 NOTE — Telephone Encounter (Signed)
-----   Message from Rosey BathMelissa C Corcoran, MD sent at 01/17/2016  1:24 PM EDT ----- Regarding: Please call patient  Folate low. She should be taking folic acid 3 mg a day (see Sherry's telephone note in 10/2015).  I am concerned about compliance. We increased monthly from 1 mg to 2 mg then 3 mg a day. At last check (before this) we had a normal folate.  Please call to confirm dosing and complaince.  M ----- Message ----- From: Interface, Lab In [place] Sent: 01/17/2016   8:56 AM To: Rosey BathMelissa C Corcoran, MD

## 2016-01-17 NOTE — Telephone Encounter (Signed)
   We should check her level again after she has been on folic acid 3 mg a day for 1 month.  We want to confirm that her level becomes therapeutic and that she maintains a therapeutic level.  M

## 2016-01-17 NOTE — Telephone Encounter (Signed)
Called pt and she was on 1 mg and inc. By corcoran to 2 mg and then inc. By corcoran to 3 mg.  When she went to her PCP several weeks ago they told her she should not be on that high of dose and dec. Her to 2 mg.  Then last week when she saw pt she was informed by corcoran that should be on 3 mg and she went back on 3 mg that day and she has not missed any doses.  I told pt that I would check with corcoran and find out what to do since level is low.

## 2016-01-26 ENCOUNTER — Inpatient Hospital Stay: Payer: Medicaid Other

## 2016-01-26 ENCOUNTER — Telehealth: Payer: Self-pay | Admitting: *Deleted

## 2016-01-26 DIAGNOSIS — E538 Deficiency of other specified B group vitamins: Secondary | ICD-10-CM

## 2016-01-26 DIAGNOSIS — D696 Thrombocytopenia, unspecified: Secondary | ICD-10-CM | POA: Diagnosis not present

## 2016-01-26 DIAGNOSIS — B182 Chronic viral hepatitis C: Secondary | ICD-10-CM

## 2016-01-26 LAB — CBC WITH DIFFERENTIAL/PLATELET
Basophils Absolute: 0.1 10*3/uL (ref 0–0.1)
Basophils Relative: 1 %
Eosinophils Absolute: 0.1 10*3/uL (ref 0–0.7)
Eosinophils Relative: 2 %
HCT: 41.3 % (ref 35.0–47.0)
Hemoglobin: 14.1 g/dL (ref 12.0–16.0)
Lymphocytes Relative: 53 %
Lymphs Abs: 2.6 10*3/uL (ref 1.0–3.6)
MCH: 35.6 pg — ABNORMAL HIGH (ref 26.0–34.0)
MCHC: 34.2 g/dL (ref 32.0–36.0)
MCV: 104 fL — ABNORMAL HIGH (ref 80.0–100.0)
Monocytes Absolute: 0.4 10*3/uL (ref 0.2–0.9)
Monocytes Relative: 7 %
Neutro Abs: 1.8 10*3/uL (ref 1.4–6.5)
Neutrophils Relative %: 37 %
Platelets: 112 10*3/uL — ABNORMAL LOW (ref 150–440)
RBC: 3.97 MIL/uL (ref 3.80–5.20)
RDW: 14 % (ref 11.5–14.5)
WBC: 4.9 10*3/uL (ref 3.6–11.0)

## 2016-01-26 NOTE — Telephone Encounter (Signed)
  Let's get a CBC today.  M

## 2016-01-26 NOTE — Telephone Encounter (Signed)
Called pt and got voicemail and left message that pt plt inc 112 and that is good.  Told her that if she gets worse with bruising or  Any bleeding then she cal lback and she will need to be seen as well as get more blood work. Asked her to call me back with questions.

## 2016-01-26 NOTE — Telephone Encounter (Signed)
Per Dr Merlene Pullingorcoran, come in for CBC. Patient will go to [place] at 230 today

## 2016-01-26 NOTE — Telephone Encounter (Signed)
-----   Message from Rosey BathMelissa C Corcoran, MD sent at 01/26/2016  3:12 PM EDT ----- Regarding: Platelet count better  Please let patient know about platelet count.  Platelet count is better than last check.  If bruising gets worse or any bleeding, patient will need to be seen and need additional testing (PT, PTT).  M  ----- Message ----- From: Interface, [facility] [place] Sent: 01/26/2016   2:57 PM To: Rosey BathMelissa C Corcoran, MD

## 2016-01-26 NOTE — Telephone Encounter (Signed)
Called to report that she is not feeling well and has 3 - 4 pin head sized bruises and has red dots coming up on her bil legs. Also has a nickle sized bruise on her right calf, and l thigh a dime sized. Feels clammy intermittently.

## 2016-02-03 ENCOUNTER — Encounter: Payer: Self-pay | Admitting: Hematology and Oncology

## 2016-02-06 ENCOUNTER — Other Ambulatory Visit: Payer: Self-pay | Admitting: *Deleted

## 2016-02-06 DIAGNOSIS — D696 Thrombocytopenia, unspecified: Secondary | ICD-10-CM

## 2016-02-07 ENCOUNTER — Inpatient Hospital Stay: Payer: Medicaid Other | Admitting: Hematology and Oncology

## 2016-02-07 ENCOUNTER — Inpatient Hospital Stay: Payer: Medicaid Other | Attending: Hematology and Oncology

## 2016-02-07 DIAGNOSIS — F1010 Alcohol abuse, uncomplicated: Secondary | ICD-10-CM | POA: Insufficient documentation

## 2016-02-07 DIAGNOSIS — F329 Major depressive disorder, single episode, unspecified: Secondary | ICD-10-CM | POA: Insufficient documentation

## 2016-02-07 DIAGNOSIS — D693 Immune thrombocytopenic purpura: Secondary | ICD-10-CM | POA: Insufficient documentation

## 2016-02-07 DIAGNOSIS — Z79899 Other long term (current) drug therapy: Secondary | ICD-10-CM | POA: Insufficient documentation

## 2016-02-07 DIAGNOSIS — F17210 Nicotine dependence, cigarettes, uncomplicated: Secondary | ICD-10-CM | POA: Insufficient documentation

## 2016-02-07 DIAGNOSIS — K7030 Alcoholic cirrhosis of liver without ascites: Secondary | ICD-10-CM | POA: Insufficient documentation

## 2016-02-07 DIAGNOSIS — E079 Disorder of thyroid, unspecified: Secondary | ICD-10-CM | POA: Insufficient documentation

## 2016-02-07 DIAGNOSIS — E538 Deficiency of other specified B group vitamins: Secondary | ICD-10-CM | POA: Insufficient documentation

## 2016-02-07 DIAGNOSIS — F988 Other specified behavioral and emotional disorders with onset usually occurring in childhood and adolescence: Secondary | ICD-10-CM | POA: Insufficient documentation

## 2016-02-07 NOTE — Progress Notes (Unsigned)
[facility] day:  02/07/16   Chief Complaint: [name] is a 35 y.o. 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  HPI:  The patient was last seen in the medical oncology clinic on 01/10/2016.  At that time, she described chronic arthralgias. Exam was stable.  CBC included a hematocrit of 37.1, hemoglobin 12.6, MCV 103.8, platelets 55,000, WBC 2500 with an ANC of 1200.    Folate level was 4.2 (low) on 01/17/2016.  She was contacted regarding her folic acid supplementation.  She contacted the clinic and complained of bruising.  CBC on 01/26/2016 revealed a platelet count of 112,000.   Symptomatically, she notes joint pain (elbow and knee).  She is on no new medications or herbal products.  She saw othopedics.  Xrays were negative.  She is drinking 1/2 pint a day (cut back).  She is taking her folic acid.   Past Medical History:  Diagnosis Date  . ADD (attention deficit disorder)   . Alcohol abuse   . 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 Cirrhosis ([place])   . Depression   . Thyroid disease     Past Surgical History:  Procedure Laterality Date  . ABDOMINAL HYSTERECTOMY    . APPENDECTOMY    . CHOLECYSTECTOMY      Family History  Problem Relation Age of Onset  . Hyperlipidemia Mother   . Cancer Father   . Hypertension Father   . Diabetes Father   . Hyperlipidemia Father     Social History:  reports that she has been smoking Cigarettes.  She started smoking about 15 years ago. She has been smoking about 1.00 pack per day. She has never used smokeless tobacco. She reports that she drinks about 2.4 oz of alcohol per week . She reports that she does not use drugs.  She has 2 children (age 8 and 12).  She has a part time job.  The patient has lived in [place] then [place] and is now back in [place].  The patient is alone today.  Allergies:  Allergies   Allergen Reactions  . Penicillins Rash    Current Medications: Current Outpatient Prescriptions  Medication Sig Dispense Refill  . dextroamphetamine (DEXTROSTAT) 10 MG tablet Take 1.5 tablets (15 mg total) by mouth 2 (two) times daily. 90 tablet 0  . diclofenac sodium (VOLTAREN) 1 % GEL Apply 2 g topically daily as needed.    . folic acid (FOLVITE) 1 MG tablet Take 1 tablet (1 mg total) by mouth daily. 30 tablet 3  . levothyroxine (SYNTHROID, LEVOTHROID) 25 MCG tablet Take 25 mcg by mouth daily.    . nitrofurantoin, macrocrystal-monohydrate, (MACROBID) 100 MG capsule Take 1 capsule (100 mg total) by mouth 2 (two) times daily. 10 capsule 0   No current facility-administered medications for this visit.     Review of Systems:  GENERAL:  Feels "ok".  No fevers, sweats.  Weight up 7 pounds. PERFORMANCE STATUS (ECOG):  1 HEENT:  No visual changes, runny nose, sore throat, mouth sores or tenderness. Lungs:  No shortness of breath or cough.  No hemoptysis. Cardiac:  No chest pain, palpitations, orthopnea, or PND. GI:  Occasional nausea.  No vomiting, diarrhea, constipation, melena or hematochezia. GU:  No urgency, frequency, dysuria, or hematuria. Musculoskeletal:  Elbow and knee pain.  No back pain.  No muscle tenderness. Extremities:  Hair thin.  No pain or  swelling. Skin:  No rashes or skin changes. Neuro:  No headache, numbness or weakness, balance or coordination issues. Endocrine:  Thyroid disease on Synthroid.  No diabetes, hot flashes or night sweats. Psych:  No mood changes, depression or anxiety. Pain:  No focal pain. Review of systems:  All other systems reviewed and found to be negative.  Physical Exam: There were no vitals taken for this visit. GENERAL:  Well developed, well nourished, woman sitting comfortably in the exam room in no acute distress. MENTAL STATUS:  Alert and oriented to person, place and time. HEAD:  Blonde hair.  Facial erythema.  Normocephalic,  atraumatic, face symmetric, no Cushingoid features. EYES:  Blue eyes.  Pupils equal round and reactive to light and accomodation. No conjunctivitis or scleral icterus. ENT: Oropharynx clear without lesion. Upper dentures. Tongue normal. Mucous membranes moist. Left nares pierced. RESPIRATORY: Clear to auscultation without rales, wheezes or rhonchi. CARDIOVASCULAR: Regular rate and rhythm without murmur, rub or gallop. ABDOMEN: Soft, non-tender, with active bowel sounds, and no hepatomegaly. Spleen tip palpable on deep inspiration. No masses. SKIN: Tattoos on arms. No rashes, ulcers or lesions. EXTREMITIES: No edema, no skin discoloration or tenderness. No palpable cords. LYMPH NODES: No palpable cervical, supraclavicular, axillary or inguinal adenopathy  NEUROLOGICAL: Unremarkable. PSYCH: Appropriate.   No visits with results within 3 Day(s) from this visit.  Latest known visit with results is:  Appointment on 01/26/2016  Component Date Value Ref Range Status  . WBC 01/26/2016 4.9  3.6 - 11.0 K/uL Final  . RBC 01/26/2016 3.97  3.80 - 5.20 MIL/uL Final  . Hemoglobin 01/26/2016 14.1  12.0 - 16.0 g/dL Final  . HCT 01/26/2016 41.3  35.0 - 47.0 % Final  . MCV 01/26/2016 104.0* 80.0 - 100.0 fL Final  . MCH 01/26/2016 35.6* 26.0 - 34.0 pg Final  . MCHC 01/26/2016 34.2  32.0 - 36.0 g/dL Final  . RDW 01/26/2016 14.0  11.5 - 14.5 % Final  . Platelets 01/26/2016 112* 150 - 440 K/uL Final  . Neutrophils Relative % 01/26/2016 37  % Final  . Neutro Abs 01/26/2016 1.8  1.4 - 6.5 K/uL Final  . Lymphocytes Relative 01/26/2016 53  % Final  . Lymphs Abs 01/26/2016 2.6  1.0 - 3.6 K/uL Final  . Monocytes Relative 01/26/2016 7  % Final  . Monocytes Absolute 01/26/2016 0.4  0.2 - 0.9 K/uL Final  . Eosinophils Relative 01/26/2016 2  % Final  . Eosinophils Absolute 01/26/2016 0.1  0 - 0.7 K/uL Final  . Basophils Relative 01/26/2016 1  % Final  . Basophils Absolute 01/26/2016 0.1  0 - 0.1 K/uL  Final    Assessment:  [name] is a 35 y.o. 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 Platelet count has ranged between 106,000 - 113,00. She is folate deficient.  She drinks alcohol (1/2 -1 pint a day) for the past 6 years. She has a history of liver cirrhosis for 4-5 years. AFP was normal on 07/05/2015.  She has no history of splenomegaly. Diet is modest. She denies any new medications or herbal products.  Work-up on 06/05/2015 revealed the following normal labs: SPEP, immunoglobulins (IgG, IgA, and IgM), hepatitis B testing, hepatitis C testing, C-ANCA, P-ANCA, beta2-glocoprotein antibodies, anti-cardiolipin antibodies, lupus anticoagulant testing, C3, C4 , and LDH.  ANA was positive on 05/24/2015.  Work-up on 07/05/2015 confirmed folate deficiency.  B12 was low normal.  MMA  was 53 (normal), thus ruling a B12 deficiency.  She has macrocytic RBC indices secondary to liver disease.  Platelet count in a blue top tube was 96,000.  Normal labs included the following:  PT/INR, PTT, TSH, and free T4.  HIV testing was negative.  Abdominal ultrasound on 01/01/2016 revealed hepatic cirrhosis.  There was no focal hepatic abnormality.  There was no splenomegaly.  Symptomatically, she has chronic arthralgias. Exam is stable.  Plan: 1.  Labs today:  CBC with diff, ferritin, ESR, iron studies.  Review abdominal ultrasound. 2.  Continue folic acid. 3.  Decrease alcohol. 4.  Anticipate every 6 month abdominal ultrasound and AFP. 5.  RTC in 1 week for labs (CBC with diff, CMP, folate, B12). 6.  RTC in 4 weeks for MD assess and labs (CBC +/- others).    Lequita Asal, MD  02/07/2016, 6:07 AM

## 2016-02-14 ENCOUNTER — Telehealth: Payer: Self-pay | Admitting: Hematology and Oncology

## 2016-02-14 ENCOUNTER — Inpatient Hospital Stay: Payer: Medicaid Other

## 2016-02-14 ENCOUNTER — Inpatient Hospital Stay (HOSPITAL_BASED_OUTPATIENT_CLINIC_OR_DEPARTMENT_OTHER): Payer: Medicaid Other | Admitting: Hematology and Oncology

## 2016-02-14 VITALS — BP 117/68 | HR 86 | Temp 96.6°F | Resp 18 | Wt 115.5 lb

## 2016-02-14 DIAGNOSIS — K7030 Alcoholic cirrhosis of liver without ascites: Secondary | ICD-10-CM | POA: Diagnosis not present

## 2016-02-14 DIAGNOSIS — F1010 Alcohol abuse, uncomplicated: Secondary | ICD-10-CM | POA: Diagnosis not present

## 2016-02-14 DIAGNOSIS — D696 Thrombocytopenia, unspecified: Secondary | ICD-10-CM | POA: Diagnosis present

## 2016-02-14 DIAGNOSIS — F17210 Nicotine dependence, cigarettes, uncomplicated: Secondary | ICD-10-CM | POA: Diagnosis not present

## 2016-02-14 DIAGNOSIS — E538 Deficiency of other specified B group vitamins: Secondary | ICD-10-CM | POA: Diagnosis not present

## 2016-02-14 DIAGNOSIS — D7589 Other specified diseases of blood and blood-forming organs: Secondary | ICD-10-CM

## 2016-02-14 DIAGNOSIS — IMO0001 Reserved for inherently not codable concepts without codable children: Secondary | ICD-10-CM

## 2016-02-14 DIAGNOSIS — Z79899 Other long term (current) drug therapy: Secondary | ICD-10-CM | POA: Diagnosis not present

## 2016-02-14 DIAGNOSIS — E079 Disorder of thyroid, unspecified: Secondary | ICD-10-CM | POA: Diagnosis not present

## 2016-02-14 DIAGNOSIS — D693 Immune thrombocytopenic purpura: Secondary | ICD-10-CM | POA: Diagnosis not present

## 2016-02-14 DIAGNOSIS — F988 Other specified behavioral and emotional disorders with onset usually occurring in childhood and adolescence: Secondary | ICD-10-CM | POA: Diagnosis not present

## 2016-02-14 DIAGNOSIS — F1020 Alcohol dependence, uncomplicated: Secondary | ICD-10-CM

## 2016-02-14 DIAGNOSIS — F329 Major depressive disorder, single episode, unspecified: Secondary | ICD-10-CM | POA: Diagnosis not present

## 2016-02-14 LAB — CBC WITH DIFFERENTIAL/PLATELET
Basophils Absolute: 0.1 10*3/uL (ref 0–0.1)
Basophils Relative: 3 %
Eosinophils Absolute: 0.1 10*3/uL (ref 0–0.7)
Eosinophils Relative: 2 %
HCT: 40 % (ref 35.0–47.0)
Hemoglobin: 13.4 g/dL (ref 12.0–16.0)
Lymphocytes Relative: 47 %
Lymphs Abs: 1.9 10*3/uL (ref 1.0–3.6)
MCH: 35.2 pg — ABNORMAL HIGH (ref 26.0–34.0)
MCHC: 33.6 g/dL (ref 32.0–36.0)
MCV: 105 fL — ABNORMAL HIGH (ref 80.0–100.0)
Monocytes Absolute: 0.4 10*3/uL (ref 0.2–0.9)
Monocytes Relative: 10 %
Neutro Abs: 1.6 10*3/uL (ref 1.4–6.5)
Neutrophils Relative %: 38 %
Platelets: 94 10*3/uL — ABNORMAL LOW (ref 150–440)
RBC: 3.81 MIL/uL (ref 3.80–5.20)
RDW: 14.2 % (ref 11.5–14.5)
WBC: 4.2 10*3/uL (ref 3.6–11.0)

## 2016-02-14 LAB — FOLATE: Folate: 4.6 ng/mL — ABNORMAL LOW (ref >5.9)

## 2016-02-14 LAB — VITAMIN B12: Vitamin B-12: 445 pg/mL (ref 180–914)

## 2016-02-14 NOTE — Telephone Encounter (Signed)
  Our day started late.  Let's have her be the first appointment of the day when she comes to clinic.  M

## 2016-02-14 NOTE — Progress Notes (Signed)
Patient states for past one month she feels like she is swollen.  States her socks are tight on her ankles.  Also has noted her clothes are fitting tighter.  States she is SOB on exertion and even has some SOB while just sitting.  She does not lie flat to sleep.  States she has a lot of pillows behind her.  States when she is in bed just watching TV she feels like she is struggling to breath. Her hands and feet feel tight.

## 2016-02-14 NOTE — Telephone Encounter (Signed)
Pt called back and apologized for leaving but said that she had waited over an hour in the exam room for the doctor and that she was becoming increasingly angry while she waited. She said that this happens every time she comes. She does not appreciate having to wait for such a long period of time and thinks it is unnecessary. Cindy DoughtyShanna would prefer you just schedule her labs a few days in advance if that is what is causing her to have to wait so long. If that is not the reason and the doctor is always late then she is considering requesting a different doctor because she can't wait that long when she comes for visits.

## 2016-02-14 NOTE — Progress Notes (Signed)
[facility] day:  02/14/16   Chief Complaint: [name] is a 35 y.o. 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  HPI:  The patient was last seen in the medical oncology clinic on 01/10/2016.  At that time, she described chronic arthralgias. Exam was stable.  CBC included a hematocrit of 37.1, hemoglobin 12.6, MCV 103.8, platelets 55,000, WBC 2500 with an ANC of 1200.    Folate level was 4.2 (low) on 01/17/2016.  She was contacted regarding her folic acid supplementation.  She contacted the clinic and complained of bruising.  CBC on 01/26/2016 revealed a platelet count of 112,000.   Symptomatically, she notes joint pain (elbow and knee).  She is on no new medications or herbal products.  She saw othopedics.  Xrays were negative.  She is drinking 1/2 pint a day (cut back).  She is taking her folic acid.   Past Medical History:  Diagnosis Date  . ADD (attention deficit disorder)   . Alcohol abuse   . 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 Cirrhosis ([place])   . Depression   . Thyroid disease     Past Surgical History:  Procedure Laterality Date  . ABDOMINAL HYSTERECTOMY    . APPENDECTOMY    . CHOLECYSTECTOMY      Family History  Problem Relation Age of Onset  . Hyperlipidemia Mother   . Cancer Father   . Hypertension Father   . Diabetes Father   . Hyperlipidemia Father     Social History:  reports that she has been smoking Cigarettes.  She started smoking about 15 years ago. She has been smoking about 1.00 pack per day. She has never used smokeless tobacco. She reports that she drinks about 2.4 oz of alcohol per week . She reports that she does not use drugs.  She has 2 children (age 81 and 55).  She has a part time job.  The patient has lived in [place] then [place] and is now back in [place].  The patient is alone today.  Allergies:  Allergies   Allergen Reactions  . Penicillins Rash    Current Medications: Current Outpatient Prescriptions  Medication Sig Dispense Refill  . dextroamphetamine (DEXTROSTAT) 10 MG tablet Take 1.5 tablets (15 mg total) by mouth 2 (two) times daily. 90 tablet 0  . diclofenac sodium (VOLTAREN) 1 % GEL Apply 2 g topically daily as needed.    . folic acid (FOLVITE) 1 MG tablet Take 1 tablet (1 mg total) by mouth daily. 30 tablet 3  . levothyroxine (SYNTHROID, LEVOTHROID) 25 MCG tablet Take 25 mcg by mouth daily.    . nitrofurantoin, macrocrystal-monohydrate, (MACROBID) 100 MG capsule Take 1 capsule (100 mg total) by mouth 2 (two) times daily. 10 capsule 0   No current facility-administered medications for this visit.     Review of Systems:  GENERAL:  Feels "ok".  No fevers, sweats.  Weight up 7 pounds. PERFORMANCE STATUS (ECOG):  1 HEENT:  No visual changes, runny nose, sore throat, mouth sores or tenderness. Lungs:  No shortness of breath or cough.  No hemoptysis. Cardiac:  No chest pain, palpitations, orthopnea, or PND. GI:  Occasional nausea.  No vomiting, diarrhea, constipation, melena or hematochezia. GU:  No urgency, frequency, dysuria, or hematuria. Musculoskeletal:  Elbow and knee pain.  No back pain.  No muscle tenderness. Extremities:  Hair thin.  No pain or  swelling. Skin:  No rashes or skin changes. Neuro:  No headache, numbness or weakness, balance or coordination issues. Endocrine:  Thyroid disease on Synthroid.  No diabetes, hot flashes or night sweats. Psych:  No mood changes, depression or anxiety. Pain:  No focal pain. Review of systems:  All other systems reviewed and found to be negative.  Physical Exam: Blood pressure 117/68, pulse 86, temperature (!) 96.6 F (35.9 C), temperature source Tympanic, resp. rate 18, weight 115 lb 8.3 oz (52.4 kg). GENERAL:  Well developed, well nourished, woman sitting comfortably in the exam room in no acute distress. MENTAL STATUS:  Alert and  oriented to person, place and time. HEAD:  Blonde hair.  Facial erythema.  Normocephalic, atraumatic, face symmetric, no Cushingoid features. EYES:  Blue eyes.  Pupils equal round and reactive to light and accomodation. No conjunctivitis or scleral icterus. ENT: Oropharynx clear without lesion. Upper dentures. Tongue normal. Mucous membranes moist. Left nares pierced. RESPIRATORY: Clear to auscultation without rales, wheezes or rhonchi. CARDIOVASCULAR: Regular rate and rhythm without murmur, rub or gallop. ABDOMEN: Soft, non-tender, with active bowel sounds, and no hepatomegaly. Spleen tip palpable on deep inspiration. No masses. SKIN: Tattoos on arms. No rashes, ulcers or lesions. EXTREMITIES: No edema, no skin discoloration or tenderness. No palpable cords. LYMPH NODES: No palpable cervical, supraclavicular, axillary or inguinal adenopathy  NEUROLOGICAL: Unremarkable. PSYCH: Appropriate.   Appointment on 02/14/2016  Component Date Value Ref Range Status  . WBC 02/14/2016 4.2  3.6 - 11.0 K/uL Final  . RBC 02/14/2016 3.81  3.80 - 5.20 MIL/uL Final  . Hemoglobin 02/14/2016 13.4  12.0 - 16.0 g/dL Final  . HCT 02/14/2016 40.0  35.0 - 47.0 % Final  . MCV 02/14/2016 105.0* 80.0 - 100.0 fL Final  . MCH 02/14/2016 35.2* 26.0 - 34.0 pg Final  . MCHC 02/14/2016 33.6  32.0 - 36.0 g/dL Final  . RDW 02/14/2016 14.2  11.5 - 14.5 % Final  . Platelets 02/14/2016 94* 150 - 440 K/uL Final  . Neutrophils Relative % 02/14/2016 38  % Final  . Neutro Abs 02/14/2016 1.6  1.4 - 6.5 K/uL Final  . Lymphocytes Relative 02/14/2016 47  % Final  . Lymphs Abs 02/14/2016 1.9  1.0 - 3.6 K/uL Final  . Monocytes Relative 02/14/2016 10  % Final  . Monocytes Absolute 02/14/2016 0.4  0.2 - 0.9 K/uL Final  . Eosinophils Relative 02/14/2016 2  % Final  . Eosinophils Absolute 02/14/2016 0.1  0 - 0.7 K/uL Final  . Basophils Relative 02/14/2016 3  % Final  . Basophils Absolute 02/14/2016 0.1  0 - 0.1 K/uL Final     Assessment:  [name] is a 35 y.o. 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 Platelet count has ranged between 106,000 - 113,00. She is folate deficient.  She drinks alcohol (1/2 -1 pint a day) for the past 6 years. She has a history of liver cirrhosis for 4-5 years. AFP was normal on 07/05/2015.  She has no history of splenomegaly. Diet is modest. She denies any new medications or herbal products.  Work-up on 06/05/2015 revealed the following normal labs: SPEP, immunoglobulins (IgG, IgA, and IgM), hepatitis B testing, hepatitis C testing, C-ANCA, P-ANCA, beta2-glocoprotein antibodies, anti-cardiolipin antibodies, lupus anticoagulant testing, C3, C4 , and LDH.  ANA was positive on 05/24/2015.  Work-up on 07/05/2015 confirmed folate deficiency.  B12 was low normal.  MMA was 53 (  normal), thus ruling a B12 deficiency.  She has macrocytic RBC indices secondary to liver disease.  Platelet count in a blue top tube was 96,000.  Normal labs included the following:  PT/INR, PTT, TSH, and free T4.  HIV testing was negative.  Abdominal ultrasound on 01/01/2016 revealed hepatic cirrhosis.  There was no focal hepatic abnormality.  There was no splenomegaly.  Symptomatically, she has chronic arthralgias. Exam is stable.  Plan: 1.  Labs today:  CBC with diff, ferritin, ESR, iron studies.  Review abdominal ultrasound. 2.  Continue folic acid. 3.  Decrease alcohol. 4.  Anticipate every 6 month abdominal ultrasound and AFP. 5.  RTC in 1 week for labs (CBC with diff, CMP, folate, B12). 6.  RTC in 4 weeks for MD assess and labs (CBC +/- others).    Lequita Asal, MD  02/14/2016, 10:18 AM

## 2016-02-15 ENCOUNTER — Telehealth: Payer: Self-pay | Admitting: *Deleted

## 2016-02-15 LAB — HEPATITIS C ANTIBODY: HCV Ab: <0.1 s/co ratio (ref 0.0–0.9)

## 2016-02-15 NOTE — Telephone Encounter (Signed)
Called patient to inform her that her folate is still low.  Patient verified that she is taking 3 mg of folic acid daily.  MD recommends she increase it.  Patient to increase it to 4 mg.  Patient was also on schedule to [facility].  She left without being seen due to long wait.  I apologized to patient for her wait and offered her the first appointment of the day next Wednesday so that MD can address some of the patient's concerns that she had yesterday during her assessment.  She agreed.  Informed her scheduler will be calling her to reschedule.

## 2016-02-15 NOTE — Telephone Encounter (Signed)
-----   Message from Rosey BathMelissa C Corcoran, MD sent at 02/14/2016  1:53 PM EDT ----- Regarding: Please call patient  Folate still low.  Has she been taking?  If so, we need to increase.  M ----- Message ----- From: Interface, [facility] [place] Sent: 02/14/2016   9:29 AM To: Rosey BathMelissa C Corcoran, MD

## 2016-02-20 ENCOUNTER — Other Ambulatory Visit: Payer: Self-pay | Admitting: *Deleted

## 2016-02-20 DIAGNOSIS — D696 Thrombocytopenia, unspecified: Secondary | ICD-10-CM

## 2016-02-21 ENCOUNTER — Inpatient Hospital Stay (HOSPITAL_BASED_OUTPATIENT_CLINIC_OR_DEPARTMENT_OTHER): Payer: Medicaid Other | Admitting: Hematology and Oncology

## 2016-02-21 ENCOUNTER — Encounter: Payer: Self-pay | Admitting: Hematology and Oncology

## 2016-02-21 ENCOUNTER — Other Ambulatory Visit: Payer: Self-pay | Admitting: *Deleted

## 2016-02-21 ENCOUNTER — Inpatient Hospital Stay: Payer: Medicaid Other

## 2016-02-21 VITALS — BP 129/88 | HR 102 | Temp 97.8°F | Ht 60.0 in | Wt 119.5 lb

## 2016-02-21 DIAGNOSIS — Z79899 Other long term (current) drug therapy: Secondary | ICD-10-CM

## 2016-02-21 DIAGNOSIS — D696 Thrombocytopenia, unspecified: Secondary | ICD-10-CM

## 2016-02-21 DIAGNOSIS — F1010 Alcohol abuse, uncomplicated: Secondary | ICD-10-CM

## 2016-02-21 DIAGNOSIS — D693 Immune thrombocytopenic purpura: Secondary | ICD-10-CM | POA: Diagnosis not present

## 2016-02-21 DIAGNOSIS — Z8619 Personal history of other infectious and parasitic diseases: Secondary | ICD-10-CM

## 2016-02-21 DIAGNOSIS — R7989 Other specified abnormal findings of blood chemistry: Secondary | ICD-10-CM

## 2016-02-21 DIAGNOSIS — K7030 Alcoholic cirrhosis of liver without ascites: Secondary | ICD-10-CM | POA: Diagnosis not present

## 2016-02-21 DIAGNOSIS — F17210 Nicotine dependence, cigarettes, uncomplicated: Secondary | ICD-10-CM

## 2016-02-21 DIAGNOSIS — E538 Deficiency of other specified B group vitamins: Secondary | ICD-10-CM

## 2016-02-21 LAB — IRON AND TIBC
Iron: 139 ug/dL (ref 28–170)
Saturation Ratios: 44 % — ABNORMAL HIGH (ref 10.4–31.8)
TIBC: 318 ug/dL (ref 250–450)
UIBC: 179 ug/dL

## 2016-02-21 LAB — FERRITIN: Ferritin: 394 ng/mL — ABNORMAL HIGH (ref 11–307)

## 2016-02-21 LAB — SEDIMENTATION RATE: Sed Rate: 2 mm/hr (ref 0–20)

## 2016-02-21 LAB — C-REACTIVE PROTEIN: CRP: <0.8 mg/dL (ref <1.0)

## 2016-02-21 NOTE — Progress Notes (Signed)
[facility] day:  02/21/16   Chief Complaint: [name] is a 35 y.o. 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  HPI:  The patient was last seen in the medical oncology clinic on 01/10/2016.  At that time, she described chronic arthralgias. Exam was stable.  CBC included a hematocrit of 37.1, hemoglobin 12.6, MCV 103.8, platelets 55,000, WBC 2500 with an ANC of 1200.  Folate level was 4.2 (low) on 01/17/2016.  She was contacted regarding her folic acid supplementation.  She contacted the clinic and complained of bruising.  CBC on 01/26/2016 revealed a platelet count of 112,000.  CBC on 02/14/2016 included a hematocrit of 40, hemoglobin 13.4, MCV 105, platelets 94,000, white count 4200 with an ANC of 1600.  Folate was 4.6. B12 was 445.  Hepatitis C testing was negative.  She was contacted regarding her low folate.  Folate was increased from 3 mg a day to 4 mg a day.  Symptomatically, she feels the same.  She continues to have joint pain.  She is on no new medications or herbal products.  She is drinks alcohol daily.  She is taking folic acid 4 mg a day.   Past Medical History:  Diagnosis Date  . ADD (attention deficit disorder)   . Alcohol abuse   . 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 Cirrhosis ([place])   . Depression   . Thyroid disease     Past Surgical History:  Procedure Laterality Date  . ABDOMINAL HYSTERECTOMY    . APPENDECTOMY    . CHOLECYSTECTOMY      Family History  Problem Relation Age of Onset  . Hyperlipidemia Mother   . Cancer Father   . Hypertension Father   . Diabetes Father   . Hyperlipidemia Father     Social History:  reports that she has been smoking Cigarettes.  She started smoking about 15 years ago. She has been smoking about 1.00 pack per day. She has never used smokeless tobacco. She reports that she drinks about 2.4 oz  of alcohol per week . She reports that she does not use drugs.  She has 2 children (age 37 and 2).  She has a part time job.  The patient has lived in [place] then [place] and is now back in [place].  The patient is accompanied by her mother today.  Allergies:  Allergies  Allergen Reactions  . Penicillins Rash    Current Medications: Current Outpatient Prescriptions  Medication Sig Dispense Refill  . dextroamphetamine (DEXTROSTAT) 10 MG tablet Take 1.5 tablets (15 mg total) by mouth 2 (two) times daily. 90 tablet 0  . diclofenac sodium (VOLTAREN) 1 % GEL Apply 2 g topically daily as needed.    . folic acid (FOLVITE) 1 MG tablet Take 1 tablet (1 mg total) by mouth daily. 30 tablet 3  . levothyroxine (SYNTHROID, LEVOTHROID) 25 MCG tablet Take 25 mcg by mouth daily.     No current facility-administered medications for this visit.     Review of Systems:  GENERAL:  Feels "ok".  No fevers, sweats.  Weight up 7 pounds. PERFORMANCE STATUS (ECOG):  1 HEENT:  No visual changes, runny nose, sore throat, mouth sores or tenderness. Lungs:  No shortness of breath or cough.  No hemoptysis. Cardiac:  No chest pain, palpitations, orthopnea, or PND. GI:  Occasional nausea.  No vomiting, diarrhea, constipation, melena or hematochezia.  GU:  No urgency, frequency, dysuria, or hematuria. Musculoskeletal:  Elbow and knee pain.  No back pain.  No muscle tenderness. Extremities:  Hair thin.  No pain or swelling. Skin:  No rashes or skin changes. Neuro:  No headache, numbness or weakness, balance or coordination issues. Endocrine:  Thyroid disease on Synthroid.  No diabetes, hot flashes or night sweats. Psych:  No mood changes, depression or anxiety. Pain:  No focal pain. Review of systems:  All other systems reviewed and found to be negative.  Physical Exam: Blood pressure 129/88, pulse (!) 102, temperature 97.8 F (36.6 C), temperature source Tympanic, height 5' (1.524 m), weight 119 lb 7.8 oz (54.2  kg). GENERAL:  Well developed, well nourished, woman sitting comfortably in the exam room in no acute distress.  She is frustrated and tearful at times. MENTAL STATUS:  Alert and oriented to person, place and time. HEAD:  Blonde/brown hair pulled back.  Facial erythema.  Normocephalic, atraumatic, face symmetric, no Cushingoid features. EYES:  Blue eyes.  Pupils equal round and reactive to light and accomodation. No conjunctivitis or scleral icterus. ENT: Oropharynx clear without lesion. Upper dentures. Tongue normal. Mucous membranes moist. Left nares pierced. RESPIRATORY: Clear to auscultation without rales, wheezes or rhonchi. CARDIOVASCULAR: Regular rate and rhythm without murmur, rub or gallop. ABDOMEN: Soft, non-tender, with active bowel sounds, and no hepatomegaly. Spleen tip just palpable. No masses. SKIN: Tattoos on arms. No rashes, ulcers or lesions. EXTREMITIES: No edema, no skin discoloration or tenderness. No palpable cords. LYMPH NODES: No palpable cervical, supraclavicular, axillary or inguinal adenopathy  NEUROLOGICAL: Unremarkable. PSYCH: Appropriate.   No visits with results within 3 Day(s) from this visit.  Latest known visit with results is:  Appointment on 02/14/2016  Component Date Value Ref Range Status  . WBC 02/14/2016 4.2  3.6 - 11.0 K/uL Final  . RBC 02/14/2016 3.81  3.80 - 5.20 MIL/uL Final  . Hemoglobin 02/14/2016 13.4  12.0 - 16.0 g/dL Final  . HCT 02/14/2016 40.0  35.0 - 47.0 % Final  . MCV 02/14/2016 105.0* 80.0 - 100.0 fL Final  . MCH 02/14/2016 35.2* 26.0 - 34.0 pg Final  . MCHC 02/14/2016 33.6  32.0 - 36.0 g/dL Final  . RDW 02/14/2016 14.2  11.5 - 14.5 % Final  . Platelets 02/14/2016 94* 150 - 440 K/uL Final  . Neutrophils Relative % 02/14/2016 38  % Final  . Neutro Abs 02/14/2016 1.6  1.4 - 6.5 K/uL Final  . Lymphocytes Relative 02/14/2016 47  % Final  . Lymphs Abs 02/14/2016 1.9  1.0 - 3.6 K/uL Final  . Monocytes Relative 02/14/2016  10  % Final  . Monocytes Absolute 02/14/2016 0.4  0.2 - 0.9 K/uL Final  . Eosinophils Relative 02/14/2016 2  % Final  . Eosinophils Absolute 02/14/2016 0.1  0 - 0.7 K/uL Final  . Basophils Relative 02/14/2016 3  % Final  . Basophils Absolute 02/14/2016 0.1  0 - 0.1 K/uL Final  . Folate 02/14/2016 4.6* >5.9 ng/mL Final  . Vitamin B-12 02/14/2016 445  180 - 914 pg/mL Final   Comment: (NOTE) This assay is not validated for testing neonatal or myeloproliferative syndrome specimens for Vitamin B12 levels. Performed at [facility]   . HCV Ab 02/15/2016 <0.1  0.0 - 0.9 s/co ratio Final   Comment: (NOTE)  Negative:     < 0.8                             Indeterminate: 0.8 - 0.9                                  Positive:     > 0.9 The CDC recommends that a positive HCV antibody result be followed up with a HCV Nucleic Acid Amplification test ([id number]). Performed At: [facility] [place], [place] [id number] Lindon Romp MD SK:[id number]     Assessment:  [name] is a 35 y.o. 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 Platelet count has ranged between 106,000 - 113,00. She is folate deficient.  She drinks alcohol (1/2 -1 pint a day) for the past 6 years. She has a history of treated hepatitis C.  She has ahistory of liver cirrhosis for 4-5 years. AFP was normal on 07/05/2015.  She has no history of splenomegaly. Diet is modest. She denies any new medications or herbal products.  Work-up on 06/05/2015 revealed the following normal labs: SPEP, immunoglobulins (IgG, IgA, and IgM), hepatitis B testing, hepatitis C testing, C-ANCA, P-ANCA, beta2-glocoprotein antibodies, anti-cardiolipin antibodies, lupus anticoagulant testing, C3, C4 , and LDH.  ANA was positive on 05/24/2015.  Work-up on 07/05/2015 confirmed  folate deficiency.  B12 was low normal.  MMA was 53 (normal), thus ruling a B12 deficiency.  She has macrocytic RBC indices secondary to liver disease.  Platelet count in a blue top tube was 96,000.  Normal labs included the following:  PT/INR, PTT, TSH, and free T4.  HIV testing was negative.  Abdominal ultrasound on 01/01/2016 revealed hepatic cirrhosis.  There was no focal hepatic abnormality.  There was no splenomegaly.  She is s/p hysterectomy.  She has chronic arhralgias.  She saw orthopedics (x-rays were negative).  Ferritin was 445 on 12/19/2015 and 394 on 02/21/2016.  Iron saturation was 44% (high) on 02/21/2016.  ESR (2) and CRP (<0.8) on 02/21/2016.  ANA is positive.  She has an appointment with [facility] rheumatology.  Symptomatically, she denies any new compaints. Exam is stable.  Plan: 1.  Review labs from last week.  Discuss low folate on supplementation and need to increase the folic acid.  Discuss follow-up folic acid testing once a month.  Discussed major symptoms of arthralgias. Etiology is unclear.  She has been referred to rheumatology.  I discussed checking her ferritin again as it was elevated in the past.  I also discussed checking a C-reactive protein and sedimentation rate.  If markers of inflammation are negative with an elevated ferritin, we discussed checking for hemochromatosis.  I discussed hemochromatosis in some detail with signs and symptoms and well as management.  2.  Continue folic acid 4 mg a day. 3.  Decrease alcohol.  Discuss prior history of hepatitis C and treatment. 4.  Discuss abdominal ultrasound and AFP every 6 months. 5.  Labs today:  ferritin, ESR, CRP, iron studies. 6.  RUQ ultrasound 07/03/2016. 7.  RTC in 1 month for labs (CBC with diff, folate level, +./- hemochomatosis assay). 8.  RTC after ultrasound for MD assessment, labs (CBC with diff, CMP, folate, AFP), and review of ultrasound.  Addendum:  Hemochromatosis testing will be added to next lab  draw  secondary to low markers of inflammation, elevated ferritin, and high iron saturation.   Lequita Asal, MD  02/21/2016, 9:04 AM

## 2016-02-21 NOTE — Progress Notes (Signed)
Patient here for follow up. She would like an naswer regarding dx. Has Rheumatology appt in January.

## 2016-02-23 ENCOUNTER — Other Ambulatory Visit: Payer: Self-pay | Admitting: *Deleted

## 2016-02-23 ENCOUNTER — Telehealth: Payer: Self-pay | Admitting: Hematology and Oncology

## 2016-02-23 DIAGNOSIS — E538 Deficiency of other specified B group vitamins: Secondary | ICD-10-CM

## 2016-02-23 MED ORDER — FOLIC ACID 1 MG PO TABS: 4.0000 mg | ORAL_TABLET | Freq: Every day | ORAL | 1 refills | Status: DC

## 2016-02-23 NOTE — Telephone Encounter (Signed)
Her folic acid Rx was called in to the wrong pharmacy. [facility] is her preferred pharmacy. Can you please call it in there instead. Also can you please change the primary pharmacy to [facility] as well. Thank you.

## 2016-02-23 NOTE — Telephone Encounter (Signed)
I sent rx for folic acid to [facility]

## 2016-02-24 DIAGNOSIS — Z8619 Personal history of other infectious and parasitic diseases: Secondary | ICD-10-CM | POA: Insufficient documentation

## 2016-02-24 DIAGNOSIS — R7989 Other specified abnormal findings of blood chemistry: Secondary | ICD-10-CM | POA: Insufficient documentation

## 2016-03-27 ENCOUNTER — Telehealth: Payer: Self-pay | Admitting: *Deleted

## 2016-03-27 ENCOUNTER — Inpatient Hospital Stay: Payer: Medicaid Other | Attending: Hematology and Oncology

## 2016-03-27 DIAGNOSIS — E538 Deficiency of other specified B group vitamins: Secondary | ICD-10-CM | POA: Insufficient documentation

## 2016-03-27 DIAGNOSIS — D696 Thrombocytopenia, unspecified: Secondary | ICD-10-CM | POA: Diagnosis present

## 2016-03-27 DIAGNOSIS — D693 Immune thrombocytopenic purpura: Secondary | ICD-10-CM | POA: Diagnosis not present

## 2016-03-27 DIAGNOSIS — R7989 Other specified abnormal findings of blood chemistry: Secondary | ICD-10-CM

## 2016-03-27 LAB — CBC WITH DIFFERENTIAL/PLATELET
Basophils Absolute: 0 10*3/uL (ref 0–0.1)
Basophils Relative: 0 %
Eosinophils Absolute: 0.1 10*3/uL (ref 0–0.7)
Eosinophils Relative: 2 %
HCT: 40.9 % (ref 35.0–47.0)
Hemoglobin: 13.7 g/dL (ref 12.0–16.0)
Lymphocytes Relative: 46 %
Lymphs Abs: 2 10*3/uL (ref 1.0–3.6)
MCH: 35.1 pg — ABNORMAL HIGH (ref 26.0–34.0)
MCHC: 33.5 g/dL (ref 32.0–36.0)
MCV: 104.9 fL — ABNORMAL HIGH (ref 80.0–100.0)
Monocytes Absolute: 0.4 10*3/uL (ref 0.2–0.9)
Monocytes Relative: 8 %
Neutro Abs: 1.9 10*3/uL (ref 1.4–6.5)
Neutrophils Relative %: 44 %
Platelets: 87 10*3/uL — ABNORMAL LOW (ref 150–440)
RBC: 3.9 MIL/uL (ref 3.80–5.20)
RDW: 13.9 % (ref 11.5–14.5)
WBC: 4.3 10*3/uL (ref 3.6–11.0)

## 2016-03-27 LAB — FOLATE: Folate: 5.2 ng/mL — ABNORMAL LOW (ref >5.9)

## 2016-03-27 NOTE — Telephone Encounter (Signed)
Called pt and told her to increase to 5 mg daily of folic acid a day and she is agreeable to the increase.

## 2016-03-27 NOTE — Telephone Encounter (Signed)
-----   Message from Rosey BathMelissa C Corcoran, MD sent at 03/27/2016  2:59 PM EST ----- Regarding: Please call patient  Folate level low.  What dose is she taking?  Needs to take 1 mg more a day.  M  ----- Message ----- From: Interface, Lab In [place] Sent: 03/27/2016   9:03 AM To: Rosey BathMelissa C Corcoran, MD

## 2016-04-02 LAB — HEMOCHROMATOSIS DNA-PCR(C282Y,H63D)

## 2016-07-02 ENCOUNTER — Other Ambulatory Visit: Payer: Self-pay | Admitting: Hematology and Oncology

## 2016-07-02 ENCOUNTER — Ambulatory Visit
Admission: RE | Admit: 2016-07-02 | Discharge: 2016-07-02 | Disposition: A | Discharge status: Home / Self Care | Payer: Medicaid Other | Source: Ambulatory Visit | Attending: Hematology and Oncology | Admitting: Hematology and Oncology

## 2016-07-02 ENCOUNTER — Telehealth: Payer: Self-pay | Admitting: *Deleted

## 2016-07-02 DIAGNOSIS — Z8619 Personal history of other infectious and parasitic diseases: Secondary | ICD-10-CM

## 2016-07-02 DIAGNOSIS — K7030 Alcoholic cirrhosis of liver without ascites: Secondary | ICD-10-CM

## 2016-07-02 DIAGNOSIS — R932 Abnormal findings on diagnostic imaging of liver and biliary tract: Secondary | ICD-10-CM

## 2016-07-02 DIAGNOSIS — R7989 Other specified abnormal findings of blood chemistry: Secondary | ICD-10-CM | POA: Diagnosis not present

## 2016-07-02 NOTE — Telephone Encounter (Signed)
Patient notified of need for liver MRI, and appt, voiced understanding.

## 2016-07-03 ENCOUNTER — Telehealth: Payer: Self-pay | Admitting: Hematology and Oncology

## 2016-07-03 NOTE — Telephone Encounter (Signed)
[name] had an US done and they called her back to do an MRI and she wants to know why? Did they see something on the US and that's why they need MRI? Please call pt

## 2016-07-05 ENCOUNTER — Telehealth: Payer: Self-pay | Admitting: *Deleted

## 2016-07-05 ENCOUNTER — Ambulatory Visit
Admission: RE | Admit: 2016-07-05 | Discharge: 2016-07-05 | Disposition: A | Discharge status: Home / Self Care | Payer: Medicaid Other | Source: Ambulatory Visit | Attending: Hematology and Oncology | Admitting: Hematology and Oncology

## 2016-07-05 DIAGNOSIS — R932 Abnormal findings on diagnostic imaging of liver and biliary tract: Secondary | ICD-10-CM | POA: Diagnosis not present

## 2016-07-05 DIAGNOSIS — K760 Fatty (change of) liver, not elsewhere classified: Secondary | ICD-10-CM | POA: Insufficient documentation

## 2016-07-05 MED ORDER — GADOXETATE DISODIUM 0.25 MMOL/ML IV SOLN
5.0000 mL | Freq: Once | INTRAVENOUS | Status: AC | PRN
  Administered 2016-07-05: 5 mL via INTRAVENOUS

## 2016-07-08 ENCOUNTER — Other Ambulatory Visit: Payer: Self-pay | Admitting: *Deleted

## 2016-07-08 DIAGNOSIS — D696 Thrombocytopenia, unspecified: Secondary | ICD-10-CM

## 2016-07-08 DIAGNOSIS — E538 Deficiency of other specified B group vitamins: Secondary | ICD-10-CM

## 2016-07-08 DIAGNOSIS — K7460 Unspecified cirrhosis of liver: Secondary | ICD-10-CM

## 2016-07-08 NOTE — Telephone Encounter (Signed)
Called patient to discuss results of liver MRI, voiced understanding of fatty infiltrate and otherwise normal MRI.

## 2016-07-10 ENCOUNTER — Inpatient Hospital Stay: Payer: Medicaid Other | Attending: Hematology and Oncology

## 2016-07-10 ENCOUNTER — Encounter: Payer: Self-pay | Admitting: Hematology and Oncology

## 2016-07-10 ENCOUNTER — Telehealth: Payer: Self-pay | Admitting: *Deleted

## 2016-07-10 ENCOUNTER — Inpatient Hospital Stay (HOSPITAL_BASED_OUTPATIENT_CLINIC_OR_DEPARTMENT_OTHER): Payer: Medicaid Other | Admitting: Hematology and Oncology

## 2016-07-10 VITALS — BP 127/84 | HR 89 | Temp 96.8°F | Resp 18 | Wt 124.8 lb

## 2016-07-10 DIAGNOSIS — G479 Sleep disorder, unspecified: Secondary | ICD-10-CM | POA: Insufficient documentation

## 2016-07-10 DIAGNOSIS — K7030 Alcoholic cirrhosis of liver without ascites: Secondary | ICD-10-CM | POA: Diagnosis not present

## 2016-07-10 DIAGNOSIS — L539 Erythematous condition, unspecified: Secondary | ICD-10-CM

## 2016-07-10 DIAGNOSIS — F17210 Nicotine dependence, cigarettes, uncomplicated: Secondary | ICD-10-CM | POA: Diagnosis not present

## 2016-07-10 DIAGNOSIS — D696 Thrombocytopenia, unspecified: Secondary | ICD-10-CM

## 2016-07-10 DIAGNOSIS — E079 Disorder of thyroid, unspecified: Secondary | ICD-10-CM

## 2016-07-10 DIAGNOSIS — M797 Fibromyalgia: Secondary | ICD-10-CM | POA: Diagnosis not present

## 2016-07-10 DIAGNOSIS — Z880 Allergy status to penicillin: Secondary | ICD-10-CM | POA: Diagnosis not present

## 2016-07-10 DIAGNOSIS — Z90710 Acquired absence of both cervix and uterus: Secondary | ICD-10-CM

## 2016-07-10 DIAGNOSIS — K760 Fatty (change of) liver, not elsewhere classified: Secondary | ICD-10-CM | POA: Insufficient documentation

## 2016-07-10 DIAGNOSIS — R7989 Other specified abnormal findings of blood chemistry: Secondary | ICD-10-CM | POA: Insufficient documentation

## 2016-07-10 DIAGNOSIS — Z79899 Other long term (current) drug therapy: Secondary | ICD-10-CM | POA: Diagnosis not present

## 2016-07-10 DIAGNOSIS — R5383 Other fatigue: Secondary | ICD-10-CM | POA: Insufficient documentation

## 2016-07-10 DIAGNOSIS — F1010 Alcohol abuse, uncomplicated: Secondary | ICD-10-CM | POA: Insufficient documentation

## 2016-07-10 DIAGNOSIS — R5382 Chronic fatigue, unspecified: Secondary | ICD-10-CM

## 2016-07-10 DIAGNOSIS — B1920 Unspecified viral hepatitis C without hepatic coma: Secondary | ICD-10-CM | POA: Diagnosis not present

## 2016-07-10 DIAGNOSIS — D693 Immune thrombocytopenic purpura: Secondary | ICD-10-CM | POA: Diagnosis not present

## 2016-07-10 DIAGNOSIS — M2550 Pain in unspecified joint: Secondary | ICD-10-CM | POA: Insufficient documentation

## 2016-07-10 DIAGNOSIS — E538 Deficiency of other specified B group vitamins: Secondary | ICD-10-CM

## 2016-07-10 DIAGNOSIS — K7460 Unspecified cirrhosis of liver: Secondary | ICD-10-CM

## 2016-07-10 DIAGNOSIS — E876 Hypokalemia: Secondary | ICD-10-CM

## 2016-07-10 LAB — CBC WITH DIFFERENTIAL/PLATELET
Basophils Absolute: 0.1 10*3/uL (ref 0–0.1)
Basophils Relative: 2 %
Eosinophils Absolute: 0.1 10*3/uL (ref 0–0.7)
Eosinophils Relative: 2 %
HCT: 39.4 % (ref 35.0–47.0)
Hemoglobin: 13.2 g/dL (ref 12.0–16.0)
Lymphocytes Relative: 49 %
Lymphs Abs: 1.7 10*3/uL (ref 1.0–3.6)
MCH: 34.6 pg — ABNORMAL HIGH (ref 26.0–34.0)
MCHC: 33.5 g/dL (ref 32.0–36.0)
MCV: 103.5 fL — ABNORMAL HIGH (ref 80.0–100.0)
Monocytes Absolute: 0.3 10*3/uL (ref 0.2–0.9)
Monocytes Relative: 7 %
Neutro Abs: 1.4 10*3/uL (ref 1.4–6.5)
Neutrophils Relative %: 40 %
Platelets: 63 10*3/uL — ABNORMAL LOW (ref 150–440)
RBC: 3.8 MIL/uL (ref 3.80–5.20)
RDW: 14.1 % (ref 11.5–14.5)
WBC: 3.4 10*3/uL — ABNORMAL LOW (ref 3.6–11.0)

## 2016-07-10 LAB — COMPREHENSIVE METABOLIC PANEL WITH GFR
ALT: 51 U/L (ref 14–54)
AST: 151 U/L — ABNORMAL HIGH (ref 15–41)
Albumin: 4 g/dL (ref 3.5–5.0)
Alkaline Phosphatase: 70 U/L (ref 38–126)
Anion gap: 11 (ref 5–15)
BUN: 6 mg/dL (ref 6–20)
CO2: 26 mmol/L (ref 22–32)
Calcium: 8.3 mg/dL — ABNORMAL LOW (ref 8.9–10.3)
Chloride: 99 mmol/L — ABNORMAL LOW (ref 101–111)
Creatinine, Ser: 0.53 mg/dL (ref 0.44–1.00)
GFR calc Af Amer: >60 mL/min
GFR calc non Af Amer: >60 mL/min
Glucose, Bld: 109 mg/dL — ABNORMAL HIGH (ref 65–99)
Potassium: 3.1 mmol/L — ABNORMAL LOW (ref 3.5–5.1)
Sodium: 136 mmol/L (ref 135–145)
Total Bilirubin: 0.5 mg/dL (ref 0.3–1.2)
Total Protein: 7.1 g/dL (ref 6.5–8.1)

## 2016-07-10 LAB — IRON AND TIBC
Iron: 110 ug/dL (ref 28–170)
Saturation Ratios: 38 % — ABNORMAL HIGH (ref 10.4–31.8)
TIBC: 292 ug/dL (ref 250–450)
UIBC: 182 ug/dL

## 2016-07-10 LAB — FERRITIN: Ferritin: 303 ng/mL (ref 11–307)

## 2016-07-10 LAB — VITAMIN B12: Vitamin B-12: 382 pg/mL (ref 180–914)

## 2016-07-10 LAB — FOLATE: Folate: 3.5 ng/mL — ABNORMAL LOW (ref >5.9)

## 2016-07-10 MED ORDER — POTASSIUM CHLORIDE ER 10 MEQ PO TBCR: 10.0000 meq | EXTENDED_RELEASE_TABLET | Freq: Two times a day (BID) | ORAL | 0 refills | Status: DC

## 2016-07-10 NOTE — Telephone Encounter (Signed)
-----   Message from Rosey BathMelissa C Corcoran, MD sent at 07/10/2016 12:56 PM EST ----- Regarding: Please call patient  Folic acid is low.  Is she taking every day?  She said she was taking 4 mg a day.  If so, then increase to 5 mg a day.  M ----- Message ----- From: Interface, Lab In [place] Sent: 07/10/2016   9:07 AM To: Rosey BathMelissa C Corcoran, MD

## 2016-07-10 NOTE — Telephone Encounter (Signed)
Called patient to confirm she is taking folic acid 4 mg daily.  She states that is correct. Advised her per VO , Dr. Merlene Pullingorcoran to increase to 5 mg daily.  Patient verbalized understanding.

## 2016-07-10 NOTE — Progress Notes (Signed)
Patient complains of right knee and elbow pain.

## 2016-07-10 NOTE — Progress Notes (Signed)
[facility] day:  07/10/16   Chief Complaint: [name] is a 36 y.o. 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  HPI:  The patient was last seen in the medical oncology clinic on 02/21/2016.  At that time, she continued to have joint pain.  She was drinking daily.  She was taking her folic acid.  Labs on 02/03/2017 revealed a folate of 4.6 (low).  B12 was 445.  Hepatitis c testing was negative.  Ferritin was 394 with an iron saturation of 44%.(previosuly 445 with a saturation of 26% 6 months prior).  ESR was 2.  CRP was < 0.8.  Labs on 03/27/2017 revealed a folate of 5.2 (low).  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  Liver ultrasound on 07/02/2016 revealed heterogeneous hypoechoic region of the right hepatic lobe which may reflect an intrahepatic mass. Hepatic protocol MRI is recommended.  Increased hepatic echotexture with subtle surface contour irregularity was consistent with cirrhosis.  Liver MRI on 07/05/2016 revealed geographic pattern of focal fatty infiltration in both right and left hepatic lobes. There was no evidence of hepatic neoplasm or other significant abnormality.  She was seen by rheumatology.  She may have fibromyalgia.    Symptomatically, she continues to have significant joint complaints.  Small and large joints are involved.  She remains fatigued.  She denies any bruising or bleeding.  She is drinking less than or equal to a pint of alcohol a day.  Previously, she would drink a pint to a fifth.  She is smoking 1/2 pack per day.  She has no menses s/p hysterectomy years ago.     Past Medical History:  Diagnosis Date  . ADD (attention deficit disorder)   . Alcohol abuse   . 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 Cirrhosis ([place])   . Depression   . Thyroid disease     Past Surgical History:  Procedure Laterality  Date  . ABDOMINAL HYSTERECTOMY    . APPENDECTOMY    . CHOLECYSTECTOMY      Family History  Problem Relation Age of Onset  . Hyperlipidemia [name]   . Cancer Father   . Hypertension Father   . Diabetes Father   . Hyperlipidemia Father     Social History:  reports that she has been smoking Cigarettes.  She started smoking about 15 years ago. She has been smoking about 1.00 pack per day. She has never used smokeless tobacco. She reports that she drinks about 2.4 oz of alcohol per week . She reports that she does not use drugs.  She has 2 children (age 69 and 30).  She has a part time job.  The patient has lived in [place] then [place] and is now back in [place].  The patient is accompanied by her [name] today.  Allergies:  Allergies  Allergen Reactions  . Penicillins Rash    Current Medications: Current Outpatient Prescriptions  Medication Sig Dispense Refill  . diclofenac sodium (VOLTAREN) 1 % GEL Apply 2 g topically daily as needed.    . folic acid (FOLVITE) 1 MG tablet Take 4 tablets (4 mg total) by mouth daily. 120 tablet 1  . levothyroxine (SYNTHROID, LEVOTHROID) 25 MCG tablet Take 25 mcg by mouth daily.    Marland Kitchen dextroamphetamine (DEXTROSTAT) 10 MG tablet Take 1.5 tablets (15 mg total) by mouth 2 (two) times daily. (Patient not taking: Reported on 07/10/2016) 90  tablet 0   No current facility-administered medications for this visit.     Review of Systems:  GENERAL:  Tired all of the time.  No fevers, sweats.  Weight up 5 pounds. PERFORMANCE STATUS (ECOG):  1 HEENT:  No visual changes, runny nose, sore throat, mouth sores or tenderness. Lungs:  No shortness of breath or cough.  No hemoptysis. Cardiac:  No chest pain, palpitations, orthopnea, or PND. GI:  No nausea, vomiting, diarrhea, constipation, melena or hematochezia. GU:  No urgency, frequency, dysuria, or hematuria. Musculoskeletal: Diffuse joint pain (small and large joints).  No back pain.  No muscle  tenderness. Extremities:  Hair thin.  No pain or swelling. Skin:  No rashes or skin changes. Neuro:  No headache, numbness or weakness, balance or coordination issues. Endocrine:  Thyroid disease on Synthroid.  No diabetes, hot flashes or night sweats. Psych:  No mood changes, depression or anxiety. Pain:  Joint pain. Review of systems:  All other systems reviewed and found to be negative.  Physical Exam: Blood pressure 127/84, pulse 89, temperature (!) 96.8 F (36 C), resp. rate 18, weight 124 lb 12.5 oz (56.6 kg). GENERAL:  Chronically fatigued appearing woman sitting comfortably in the exam room in no acute distress.   MENTAL STATUS:  Alert and oriented to person, place and time. HEAD:  Blonde/brown hair pulled back.  Facial fullness with erythema.  Normocephalic, atraumatic, face symmetric, no Cushingoid features. EYES:  Blue eyes.  Pupils equal round and reactive to light and accomodation. No conjunctivitis or scleral icterus. ENT: Oropharynx clear without lesion. Upper dentures. Tongue normal. Mucous membranes moist. Left nares pierced. RESPIRATORY: Clear to auscultation without rales, wheezes or rhonchi. CARDIOVASCULAR: Regular rate and rhythm without murmur, rub or gallop. ABDOMEN: Soft, non-tender, with active bowel sounds.  Liver edge nodular. Spleen tip palpable. No masses. SKIN: Tattoos on arms and back. No rashes, ulcers or lesions. EXTREMITIES: No edema, no skin discoloration or tenderness. No palpable cords. LYMPH NODES: No palpable cervical, supraclavicular, axillary or inguinal adenopathy  NEUROLOGICAL: Unremarkable. PSYCH: Appropriate.   Appointment on 07/10/2016  Component Date Value Ref Range Status  . WBC 07/10/2016 3.4* 3.6 - 11.0 K/uL Final  . RBC 07/10/2016 3.80  3.80 - 5.20 MIL/uL Final  . Hemoglobin 07/10/2016 13.2  12.0 - 16.0 g/dL Final  . HCT 07/10/2016 39.4  35.0 - 47.0 % Final  . MCV 07/10/2016 103.5* 80.0 - 100.0 fL Final  . MCH  07/10/2016 34.6* 26.0 - 34.0 pg Final  . MCHC 07/10/2016 33.5  32.0 - 36.0 g/dL Final  . RDW 07/10/2016 14.1  11.5 - 14.5 % Final  . Platelets 07/10/2016 63* 150 - 440 K/uL Final  . Neutrophils Relative % 07/10/2016 40  % Final  . Neutro Abs 07/10/2016 1.4  1.4 - 6.5 K/uL Final  . Lymphocytes Relative 07/10/2016 49  % Final  . Lymphs Abs 07/10/2016 1.7  1.0 - 3.6 K/uL Final  . Monocytes Relative 07/10/2016 7  % Final  . Monocytes Absolute 07/10/2016 0.3  0.2 - 0.9 K/uL Final  . Eosinophils Relative 07/10/2016 2  % Final  . Eosinophils Absolute 07/10/2016 0.1  0 - 0.7 K/uL Final  . Basophils Relative 07/10/2016 2  % Final  . Basophils Absolute 07/10/2016 0.1  0 - 0.1 K/uL Final  . Sodium 07/10/2016 136  135 - 145 mmol/L Final  . Potassium 07/10/2016 3.1* 3.5 - 5.1 mmol/L Final  . Chloride 07/10/2016 99* 101 - 111 mmol/L Final  . CO2  07/10/2016 26  22 - 32 mmol/L Final  . Glucose, Bld 07/10/2016 109* 65 - 99 mg/dL Final  . BUN 07/10/2016 6  6 - 20 mg/dL Final  . Creatinine, Ser 07/10/2016 0.53  0.44 - 1.00 mg/dL Final  . Calcium 07/10/2016 8.3* 8.9 - 10.3 mg/dL Final  . Total Protein 07/10/2016 7.1  6.5 - 8.1 g/dL Final  . Albumin 07/10/2016 4.0  3.5 - 5.0 g/dL Final  . AST 07/10/2016 151* 15 - 41 U/L Final  . ALT 07/10/2016 51  14 - 54 U/L Final  . Alkaline Phosphatase 07/10/2016 70  38 - 126 U/L Final  . Total Bilirubin 07/10/2016 0.5  0.3 - 1.2 mg/dL Final  . GFR calc non Af Amer 07/10/2016 >60  >60 mL/min Final  . GFR calc Af Amer 07/10/2016 >60  >60 mL/min Final   Comment: (NOTE) The eGFR has been calculated using the CKD EPI equation. This calculation has not been validated in all clinical situations. eGFR's persistently <60 mL/min signify possible Chronic Kidney Disease.   . Anion gap 07/10/2016 11  5 - 15 Final    Assessment:  [name] is a 36 y.o. female with a history of intermittent thrombocytopenia for 16 years possibly related to mild immune mediated  thrombocytopenic purpura (ITP) and alcohol use. Platelet count has ranged between 106,000 - 113,00. She has folate deficiency.  She drinks alcohol (1/2- 1 pint a day) for the past 7 years. She has a history of treated hepatitis C.  She has ahistory of liver cirrhosis for 4-5 years. AFP was normal on 07/05/2015.  She has no history of splenomegaly. Diet is modest. She denies any new medications or herbal products.  Work-up on 06/05/2015 revealed the following normal labs: SPEP, immunoglobulins (IgG, IgA, and IgM), hepatitis B testing, hepatitis C testing, C-ANCA, P-ANCA, beta2-glocoprotein antibodies, anti-cardiolipin antibodies, lupus anticoagulant testing, C3, C4 , and LDH.  ANA was positive on 05/24/2015.  Work-up on 07/05/2015 confirmed folate deficiency.  She has been on daily folic acid.  Current folate dose is 4 mg (persistent low levels on 1, 2, and 3 mg a day).  B12 was low normal.  MMA was 53 (normal), thus ruling a B12 deficiency.    She has macrocytic RBC indices secondary to liver disease.  Platelet count in a blue top tube was 96,000.  Normal labs included the following:  PT/INR, PTT, TSH, and free T4.  HIV testing was negative.  Abdominal ultrasound on 01/01/2016 revealed hepatic cirrhosis.  There was no focal hepatic abnormality.  There was no splenomegaly.  RUQ ultrasound on 07/02/2016 revealed heterogeneous hypoechoic region of the right hepatic lobe which may reflect an intrahepatic mass.  Increased hepatic echotexture with subtle surface contour irregularity was consistent with cirrhosis.  Liver MRI on 07/05/2016 revealed geographic pattern of focal fatty infiltration in both right and left hepatic lobes. There was no evidence of hepatic neoplasm or other significant abnormality.  She is s/p hysterectomy.  She has chronic arhralgias.  She saw orthopedics (x-rays were negative) and rheumatology.  She has been told that she has fibromyalgia.  ANA is positive.  Hemochromatosis  testing revealed a single mutation (S65C).  Ferritin has been followed: 445 on 12/19/2015, 394 on 02/21/2016, and 303 on 07/10/2016.  Iron saturation was 26% on 12/19/2015, 44% on 02/21/2016, and 38% on 07/10/2016.  ESR (2) and CRP (<0.8) on 02/21/2016.    Symptomatically, she remains fatigued.  She has diffuse joints complaints.  She is drinking 1/2 pint  alcohol a day. Exam is stable.  Potassium is 3.1.  Plan: 1.  Labs today:  CBC with diff, CMP, folate, B12, ferritin, iron studies, AFP. 2.  Review RUQ ultrasound and liver MRI.  No masses seen.  Fatty infiltration only.  Continue imaging every 6 months. 3.  Discuss arthritis symptoms and elevated ferritin.  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  Ferritin has been elevated 300-400 range (decreasing with time).  Etiology may be due to liver damage.  ESR and CRP have been normal and thus no clear inflammatory cause increasing ferritin.  Patient without menses.  Typically, ferritin should increase over time in patients with hemochromatosis without intervention.  Unclear if arthralgias related to excess iron (doubt).  Could consider a phlebotomy trial.  Review with radiology recent liver MRI and assessment of iron.  Discuss referral to GI for hepatic evaluation. 4.  Follow-up with radiology re:  Liver MRI. 5.  Nurse to call patient with lab results 6.  Consult GI (Dr Allen Norris) 7.  Rx:  Potassium chloride 10 meq BID x 3 days. 8.  RTC 1 week after appt with Dr Allen Norris.   Lequita Asal, MD  07/10/2016, 9:20 AM

## 2016-07-11 LAB — AFP TUMOR MARKER: AFP-Tumor Marker: 5.1 ng/mL (ref 0.0–8.3)

## 2016-07-17 ENCOUNTER — Ambulatory Visit (INDEPENDENT_AMBULATORY_CARE_PROVIDER_SITE_OTHER): Payer: Medicaid Other | Admitting: Gastroenterology

## 2016-07-17 ENCOUNTER — Encounter: Payer: Self-pay | Admitting: Gastroenterology

## 2016-07-17 VITALS — BP 109/65 | HR 100 | Temp 98.7°F | Ht 60.0 in | Wt 122.0 lb

## 2016-07-17 DIAGNOSIS — K7460 Unspecified cirrhosis of liver: Secondary | ICD-10-CM | POA: Diagnosis not present

## 2016-07-17 NOTE — Progress Notes (Signed)
Gastroenterology Consultation  Referring Provider:     Etheleen NicksMacDonald, Keri, NP Primary Care Physician:  Etheleen NicksKeri MacDonald, NP Primary Gastroenterologist:  Dr. Servando SnareWohl     Reason for Consultation:     Alcoholic liver disease        HPI:   [name] is a 36 y.o. y/o female referred for consultation & management of Alcoholic liver disease by Dr. Etheleen NicksKeri MacDonald, NP.  This patient comes in today with a history of alcohol abuse for approximately 6 years. The patient states that she was drinking more than a pint of alcohol a day. She was typically using [place] and had been switched of vodka. The patient states that her husband died in his early 5450s from hepatitis C and alcohol abuse. The patient was found to have a nodular liver on ultrasound with suspected masses. An MRI of the liver did not show any liver masses and the alpha-fetoprotein was negative. The patient does have thrombocytopenia from ITP. 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 The patient was also found to have a decreased folate level and chronic of liver enzymes. The patient's AST has been elevated with the most recent elevation being 151 last week. The patient states she quit drinking last week. The patient states she does not understand how she has liver disease when she knows people who drank more than her and have lived to a old age despite the drinking.  Past Medical History:  Diagnosis Date  . ADD (attention deficit disorder)   . Alcohol abuse   . 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 Cirrhosis (HCC)   . Depression   . Thyroid disease     Past Surgical History:  Procedure Laterality Date  . ABDOMINAL HYSTERECTOMY    . APPENDECTOMY    . CHOLECYSTECTOMY      Prior to Admission medications   Medication Sig Start Date End Date Taking? Authorizing Provider  diclofenac sodium (VOLTAREN) 1 % GEL Apply 2 g topically daily as needed.   Yes Historical  Provider, MD  folic acid (FOLVITE) 1 MG tablet Take 4 tablets (4 mg total) by mouth daily. 02/23/16  Yes Rosey BathMelissa C Corcoran, MD  levothyroxine (SYNTHROID, LEVOTHROID) 25 MCG tablet Take 25 mcg by mouth daily.   Yes Historical Provider, MD  potassium chloride (K-DUR) 10 MEQ tablet Take 1 tablet (10 mEq total) by mouth 2 (two) times daily. 07/10/16  Yes Rosey BathMelissa C Corcoran, MD  dextroamphetamine (DEXTROSTAT) 10 MG tablet Take 1.5 tablets (15 mg total) by mouth 2 (two) times daily. Patient not taking: Reported on 07/10/2016 09/12/15   Kerman PasseyMelinda P Lada, MD    Family History  Problem Relation Age of Onset  . Hyperlipidemia Mother   . Cancer Father   . Hypertension Father   . Diabetes Father   . Hyperlipidemia Father      Social History  Substance Use Topics  . Smoking status: Current Every Day Smoker    Packs/day: 1.00    Types: Cigarettes    Start date: 12/31/2000  . Smokeless tobacco: Never Used  . Alcohol use 2.4 oz/week    4 Shots of liquor per week     Comment: excessively    Allergies as of 07/17/2016 - Review Complete 07/17/2016  Allergen Reaction Noted  . Penicillins Rash 03/14/2015    Review of Systems:    All systems reviewed and negative except where noted in HPI.   Physical Exam:  BP 109/65   Pulse 100   Temp 98.7 F (37.1 C) (Oral)   Ht 5' (1.524 m)   Wt 122 lb (55.3 kg)   BMI 23.83 kg/m  No LMP recorded. Patient has had a hysterectomy. Psych:  Alert and cooperative. Normal mood and affect. General:   Alert,  Well-developed, well-nourished, pleasant and cooperative in NAD Head:  Normocephalic and atraumatic. Eyes:  Sclera clear, no icterus.   Conjunctiva pink. Ears:  Normal auditory acuity. Nose:  No deformity, discharge, or lesions. Mouth:  No deformity or lesions,oropharynx pink & moist. Neck:  Supple; no masses or thyromegaly. Lungs:  Respirations even and unlabored.  Clear throughout to auscultation.   No wheezes, crackles, or rhonchi. No acute  distress. Heart:  Regular rate and rhythm; no murmurs, clicks, rubs, or gallops. Abdomen:  Normal bowel sounds.  No bruits.  Soft, non-tender and non-distended without masses, Positive hepatomegaly without hernias noted.  No guarding or rebound tenderness.  Negative Carnett sign.   Rectal:  Deferred.  Msk:  Symmetrical without gross deformities.  Good, equal movement & strength bilaterally. Pulses:  Normal pulses noted. Extremities:  No clubbing or edema.  No cyanosis. Neurologic:  Alert and oriented x3;  grossly normal neurologically. Skin:  Intact without significant lesions or rashes.  No jaundice. Lymph Nodes:  No significant cervical adenopathy. Psych:  Alert and cooperative. Normal mood and affect.  Imaging Studies: Mr Abdomen W Wo Contrast  Result Date: 07/05/2016 CLINICAL DATA:  Cirrhosis. Liver lesion seen on recent ultrasound. Abdominal pain, nausea, and diarrhea for several months. Personal history of cervical carcinoma EXAM: MRI ABDOMEN WITHOUT AND WITH CONTRAST TECHNIQUE: Multiplanar multisequence MR imaging of the abdomen was performed both before and after the administration of intravenous contrast. CONTRAST:  5 mL Eovist COMPARISON:  Ultrasound on 07/02/2016 FINDINGS: Lower chest: No acute findings. Hepatobiliary: No masses identified. Geographic pattern of focal fatty infiltration is seen in both the right and left hepatic lobes. A tiny sub-cm cyst is also seen in the inferior right hepatic lobe. Prior cholecystectomy noted. No evidence of biliary dilatation. Pancreas:  No mass or inflammatory changes. Spleen:  Within normal limits in size and appearance. Adrenals/Urinary Tract: No masses identified. No evidence of hydronephrosis. Stomach/Bowel: Visualized portions within the abdomen are unremarkable. Vascular/Lymphatic: No pathologically enlarged lymph nodes identified. No abdominal aortic aneurysm. Other:  None. Musculoskeletal:  No suspicious bone lesions identified. IMPRESSION:  Geographic pattern of focal fatty infiltration in both right and left hepatic lobes. No evidence of hepatic neoplasm or other significant abnormality. Electronically Signed   By: Myles Rosenthal M.D.   On: 07/05/2016 11:32   US Abdomen Limited Ruq  Result Date: 07/02/2016 CLINICAL DATA:  Hepatic cirrhosis, previous cholecystectomy. EXAM: US ABDOMEN LIMITED - RIGHT UPPER QUADRANT COMPARISON:  Abdominal ultrasound of January 01, 2016 FINDINGS: Gallbladder: The gallbladder is surgically absent. Common bile duct: Diameter: 3.6 mm Liver: The hepatic echotexture is increased. In the right hepatic lobe there is an area of decreased echogenicity measuring 6.3 x 9.4 x 3.5 cm. This is slightly more conspicuous than on the previous study. There is no intrahepatic ductal dilation. The surface contour of the liver is minimally irregular. IMPRESSION: Heterogeneous hypoechoic region of the right hepatic lobe which may reflect an intrahepatic mass. Hepatic protocol MRI is recommended. Increased hepatic echotexture with subtle surface contour irregularity is consistent with cirrhosis. Electronically Signed   By: David  [place] M.D.   On: 07/02/2016 09:23    Assessment and Plan:   Devonne Doughty  Lady is a 36 y.o. y/o female who comes in with a history of alcohol abuse and a nodular liver. The patient has been told in the past that she has cirrhosis. The patient's most recent MRI did not show any liver cancers. The patient's alpha-fetoprotein is also low. The patient has been told to take folate supplementation. She has also been told to seek professional help to stop drinking. She will have her labs repeated in 1 month to make sure the liver enzymes are back to normal. If they are not at that time the patient may need a workup for other possible causes of her abnormal liver enzymes. The biggest impact on this patient's health will be for her to stop drinking. She has been explained this and will follow-up in one month with labs in 3  months in the office. She'll also need hepatocellular carcinoma surveillance every 6 months and has been explained that.    Midge Minium, MD. Clementeen Graham   Note: This dictation was prepared with Dragon dictation along with smaller phrase technology. Any transcriptional errors that result from this process are unintentional.

## 2016-07-24 ENCOUNTER — Inpatient Hospital Stay (HOSPITAL_BASED_OUTPATIENT_CLINIC_OR_DEPARTMENT_OTHER): Payer: Medicaid Other | Admitting: Hematology and Oncology

## 2016-07-24 VITALS — BP 134/91 | HR 99 | Temp 98.1°F | Resp 18 | Wt 124.3 lb

## 2016-07-24 DIAGNOSIS — K760 Fatty (change of) liver, not elsewhere classified: Secondary | ICD-10-CM | POA: Diagnosis not present

## 2016-07-24 DIAGNOSIS — L539 Erythematous condition, unspecified: Secondary | ICD-10-CM | POA: Diagnosis not present

## 2016-07-24 DIAGNOSIS — R7989 Other specified abnormal findings of blood chemistry: Secondary | ICD-10-CM

## 2016-07-24 DIAGNOSIS — E079 Disorder of thyroid, unspecified: Secondary | ICD-10-CM | POA: Diagnosis not present

## 2016-07-24 DIAGNOSIS — K7030 Alcoholic cirrhosis of liver without ascites: Secondary | ICD-10-CM

## 2016-07-24 DIAGNOSIS — K7460 Unspecified cirrhosis of liver: Secondary | ICD-10-CM

## 2016-07-24 DIAGNOSIS — M2550 Pain in unspecified joint: Secondary | ICD-10-CM

## 2016-07-24 DIAGNOSIS — R5383 Other fatigue: Secondary | ICD-10-CM

## 2016-07-24 DIAGNOSIS — B1920 Unspecified viral hepatitis C without hepatic coma: Secondary | ICD-10-CM

## 2016-07-24 DIAGNOSIS — E538 Deficiency of other specified B group vitamins: Secondary | ICD-10-CM

## 2016-07-24 DIAGNOSIS — M797 Fibromyalgia: Secondary | ICD-10-CM

## 2016-07-24 DIAGNOSIS — D696 Thrombocytopenia, unspecified: Secondary | ICD-10-CM

## 2016-07-24 DIAGNOSIS — G479 Sleep disorder, unspecified: Secondary | ICD-10-CM | POA: Diagnosis not present

## 2016-07-24 DIAGNOSIS — F1010 Alcohol abuse, uncomplicated: Secondary | ICD-10-CM

## 2016-07-24 DIAGNOSIS — F17210 Nicotine dependence, cigarettes, uncomplicated: Secondary | ICD-10-CM

## 2016-07-24 DIAGNOSIS — Z90710 Acquired absence of both cervix and uterus: Secondary | ICD-10-CM

## 2016-07-24 DIAGNOSIS — D693 Immune thrombocytopenic purpura: Secondary | ICD-10-CM

## 2016-07-24 DIAGNOSIS — Z880 Allergy status to penicillin: Secondary | ICD-10-CM

## 2016-07-24 DIAGNOSIS — Z79899 Other long term (current) drug therapy: Secondary | ICD-10-CM

## 2016-07-24 NOTE — Progress Notes (Signed)
[facility] day:  07/24/2016  Chief Complaint: [name] is a 36 y.o. 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  HPI:  The patient was last seen in the medical oncology clinic on 07/10/2016.  At that time, she remained fatigued.  She had diffuse joints complaints.  She was drinking 1/2 pint alcohol a day. Exam was stable. RUQ ultrasound revealed no masses.  Liver MRI on 07/05/2016 revealed geographic pattern of focal fatty infiltration in both right and left hepatic lobes. There was no evidence of hepatic neoplasm or other significant abnormality.  Dr. Allen Norris was consulted.  She saw Dr. Lucilla Lame on 07/17/2016.  She was encouraged to stop drinking.  Follow-up was planned regarding her elevated liver function tests.  It was recommended that she continue every 6 month surveillance of her liver.   She states that she stopped drinking on Saturday (07/20/2016).  She notes feeling tired.  Her sleep has been poor.   Past Medical History:  Diagnosis Date  . ADD (attention deficit disorder)   . Alcohol abuse   . 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 Cirrhosis ([place])   . Depression   . Thyroid disease     Past Surgical History:  Procedure Laterality Date  . ABDOMINAL HYSTERECTOMY    . APPENDECTOMY    . CHOLECYSTECTOMY      Family History  Problem Relation Age of Onset  . Hyperlipidemia Mother   . Cancer Father   . Hypertension Father   . Diabetes Father   . Hyperlipidemia Father     Social History:  reports that she has been smoking Cigarettes.  She started smoking about 15 years ago. She has been smoking about 1.00 pack per day. She has never used smokeless tobacco. She reports that she drinks about 2.4 oz of alcohol per week . She reports that she does not use drugs.  She has 2 children (age 61 and 51).  She has a part time job.  The patient has lived  in [place] then [place] and is now back in [place].  The patient is alone today.  Allergies:  Allergies  Allergen Reactions  . Penicillins Rash    Current Medications: Current Outpatient Prescriptions  Medication Sig Dispense Refill  . diclofenac sodium (VOLTAREN) 1 % GEL Apply 2 g topically daily as needed.    . folic acid (FOLVITE) 1 MG tablet Take 4 tablets (4 mg total) by mouth daily. 120 tablet 1  . levothyroxine (SYNTHROID, LEVOTHROID) 25 MCG tablet Take 25 mcg by mouth daily.    Marland Kitchen dextroamphetamine (DEXTROSTAT) 10 MG tablet Take 1.5 tablets (15 mg total) by mouth 2 (two) times daily. (Patient not taking: Reported on 07/10/2016) 90 tablet 0  . potassium chloride (K-DUR) 10 MEQ tablet Take 1 tablet (10 mEq total) by mouth 2 (two) times daily. (Patient not taking: Reported on 07/24/2016) 6 tablet 0   No current facility-administered medications for this visit.     Review of Systems:  GENERAL:  Tired.  Poor sleep.  No fevers, sweats.  Weight stable. PERFORMANCE STATUS (ECOG):  1 HEENT:  Sore throat.  No visual changes, runny nose, mouth sores or tenderness. Lungs:  Shortness of breath on exertion. No cough.  No hemoptysis. Cardiac:  No chest pain, palpitations, orthopnea, or PND. GI:  No nausea, vomiting, diarrhea, constipation, melena or hematochezia. GU:  No urgency, frequency, dysuria, or  hematuria. Musculoskeletal: Diffuse joint pain (small and large joints).  No back pain.  No muscle tenderness. Extremities:  Hair thin.  No pain or swelling. Skin:  No rashes or skin changes. Neuro:  No headache, numbness or weakness, balance or coordination issues. Endocrine:  Thyroid disease on Synthroid.  No diabetes, hot flashes or night sweats. Psych:  No mood changes, depression or anxiety. Pain:  Joint pain. Review of systems:  All other systems reviewed and found to be negative.  Physical Exam: Blood pressure (!) 134/91, pulse 99, temperature 98.1 F (36.7 C), resp. rate 18, weight  124 lb 5.4 oz (56.4 kg). GENERAL:  Chronically fatigued appearing woman sitting comfortably in the exam room in no acute distress.   MENTAL STATUS:  Alert and oriented to person, place and time. HEAD:  Blonde/brown hair pulled back.  Facial fullness with erythema.  Normocephalic, atraumatic, face symmetric, no Cushingoid features. EYES:  Blue eyes.  No conjunctivitis or scleral icterus. NEUROLOGICAL: Unremarkable. PSYCH: Appropriate.   No visits with results within 3 Day(s) from this visit.  Latest known visit with results is:  Appointment on 07/10/2016  Component Date Value Ref Range Status  . WBC 07/10/2016 3.4* 3.6 - 11.0 K/uL Final  . RBC 07/10/2016 3.80  3.80 - 5.20 MIL/uL Final  . Hemoglobin 07/10/2016 13.2  12.0 - 16.0 g/dL Final  . HCT 07/10/2016 39.4  35.0 - 47.0 % Final  . MCV 07/10/2016 103.5* 80.0 - 100.0 fL Final  . MCH 07/10/2016 34.6* 26.0 - 34.0 pg Final  . MCHC 07/10/2016 33.5  32.0 - 36.0 g/dL Final  . RDW 07/10/2016 14.1  11.5 - 14.5 % Final  . Platelets 07/10/2016 63* 150 - 440 K/uL Final  . Neutrophils Relative % 07/10/2016 40  % Final  . Neutro Abs 07/10/2016 1.4  1.4 - 6.5 K/uL Final  . Lymphocytes Relative 07/10/2016 49  % Final  . Lymphs Abs 07/10/2016 1.7  1.0 - 3.6 K/uL Final  . Monocytes Relative 07/10/2016 7  % Final  . Monocytes Absolute 07/10/2016 0.3  0.2 - 0.9 K/uL Final  . Eosinophils Relative 07/10/2016 2  % Final  . Eosinophils Absolute 07/10/2016 0.1  0 - 0.7 K/uL Final  . Basophils Relative 07/10/2016 2  % Final  . Basophils Absolute 07/10/2016 0.1  0 - 0.1 K/uL Final  . Sodium 07/10/2016 136  135 - 145 mmol/L Final  . Potassium 07/10/2016 3.1* 3.5 - 5.1 mmol/L Final  . Chloride 07/10/2016 99* 101 - 111 mmol/L Final  . CO2 07/10/2016 26  22 - 32 mmol/L Final  . Glucose, Bld 07/10/2016 109* 65 - 99 mg/dL Final  . BUN 07/10/2016 6  6 - 20 mg/dL Final  . Creatinine, Ser 07/10/2016 0.53  0.44 - 1.00 mg/dL Final  . Calcium 07/10/2016 8.3* 8.9 -  10.3 mg/dL Final  . Total Protein 07/10/2016 7.1  6.5 - 8.1 g/dL Final  . Albumin 07/10/2016 4.0  3.5 - 5.0 g/dL Final  . AST 07/10/2016 151* 15 - 41 U/L Final  . ALT 07/10/2016 51  14 - 54 U/L Final  . Alkaline Phosphatase 07/10/2016 70  38 - 126 U/L Final  . Total Bilirubin 07/10/2016 0.5  0.3 - 1.2 mg/dL Final  . GFR calc non Af Amer 07/10/2016 >60  >60 mL/min Final  . GFR calc Af Amer 07/10/2016 >60  >60 mL/min Final   Comment: (NOTE) The eGFR has been calculated using the CKD EPI equation. This calculation has not been  validated in all clinical situations. eGFR's persistently <60 mL/min signify possible Chronic Kidney Disease.   . Anion gap 07/10/2016 11  5 - 15 Final  . Folate 07/10/2016 3.5* >5.9 ng/mL Final  . AFP-Tumor Marker 07/10/2016 5.1  0.0 - 8.3 ng/mL Final   Comment: (NOTE) Roche ECLIA methodology Performed At: [facility] [place], [place] [id number] Lindon Romp MD UD:[id number]   . Vitamin B-12 07/10/2016 382  180 - 914 pg/mL Final   Comment: (NOTE) This assay is not validated for testing neonatal or myeloproliferative syndrome specimens for Vitamin B12 levels. Performed at [facility], [place] [address], [place], [place] [address]   . Ferritin 07/10/2016 303  11 - 307 ng/mL Final  . Iron 07/10/2016 110  28 - 170 ug/dL Final  . TIBC 07/10/2016 292  250 - 450 ug/dL Final  . Saturation Ratios 07/10/2016 38* 10.4 - 31.8 % Final  . UIBC 07/10/2016 182  ug/dL Final    Assessment:  [name] is a 36 y.o. 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 Platelet count has ranged between 106,000 - 113,00. She has folate deficiency.  She drinks alcohol (1/2- 1 pint a day) for the past 7 years. She has a history of treated hepatitis C.  She has ahistory of liver cirrhosis for 4-5 years. AFP was normal on 07/05/2015.  She  has no history of splenomegaly. Diet is modest. She denies any new medications or herbal products.  Work-up on 06/05/2015 revealed the following normal labs: SPEP, immunoglobulins (IgG, IgA, and IgM), hepatitis B testing, hepatitis C testing, C-ANCA, P-ANCA, beta2-glocoprotein antibodies, anti-cardiolipin antibodies, lupus anticoagulant testing, C3, C4 , and LDH.  ANA was positive on 05/24/2015.  Work-up on 07/05/2015 confirmed folate deficiency.  She has been on daily folic acid.  Current folate dose is 4 mg (persistent low levels on 1, 2, and 3 mg a day).  B12 was low normal.  MMA was 53 (normal), thus ruling a B12 deficiency.    She has macrocytic RBC indices secondary to liver disease.  Platelet count in a blue top tube was 96,000.  Normal labs included the following:  PT/INR, PTT, TSH, and free T4.  HIV testing was negative.  Abdominal ultrasound on 01/01/2016 revealed hepatic cirrhosis.  There was no focal hepatic abnormality.  There was no splenomegaly.  RUQ ultrasound on 07/02/2016 revealed heterogeneous hypoechoic region of the right hepatic lobe which may reflect an intrahepatic mass.  Increased hepatic echotexture with subtle surface contour irregularity was consistent with cirrhosis.  Liver MRI on 07/05/2016 revealed geographic pattern of focal fatty infiltration in both right and left hepatic lobes. There was no evidence of hepatic neoplasm or other significant abnormality.  She is s/p hysterectomy.  She has chronic arhralgias.  She saw orthopedics (x-rays were negative) and rheumatology.  She has been told that she has fibromyalgia.  ANA is positive.  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  Ferritin has been followed: 445 on 12/19/2015, 394 on 02/21/2016, and 303 on 07/10/2016.  Iron saturation was 26% on 12/19/2015, 44% on 02/21/2016, and 38% on 07/10/2016.  ESR (2) and CRP (<0.8) on 02/21/2016.    Symptomatically, she remains fatigued.  She has diffuse joints  complaints.  She is drinking 1/2 pint alcohol a day. Exam is stable.  Potassium is 3.1.  Plan: 1.  Discuss consult with Dr. Allen Norris. 2.  Follow-up  with radiology regarding recent liver MRI and estimated iron deposition in live. 3.  Encourage not drinking alcohol.   4.  Schedule abdominal ultrasound 12/06/2016. 5.  RTC on 08/07/2016 for labs (CBC, folate, MMA) 6.  RTC in 3 months for MD assessment, labs (CBC with diff, CMP, folate, B12).  Addendum:  Liver MRI reveals no increased iron deposition.  Spleen is normal.  There is decreased T2 hypodensity.   Lequita Asal, MD  07/24/2016

## 2016-07-24 NOTE — Progress Notes (Signed)
Patient is here today for follow up after seeing Dr. Servando SnareWohl.  Patient states she thinks she is getting a cold.  She states her throat feels like swallowing glass.

## 2016-08-07 ENCOUNTER — Ambulatory Visit: Payer: Medicaid Other | Attending: Obstetrics and Gynecology | Admitting: Physical Therapy

## 2016-08-07 ENCOUNTER — Encounter: Payer: Self-pay | Admitting: Physical Therapy

## 2016-08-07 ENCOUNTER — Inpatient Hospital Stay: Payer: Medicaid Other | Attending: Hematology and Oncology

## 2016-08-07 DIAGNOSIS — E538 Deficiency of other specified B group vitamins: Secondary | ICD-10-CM | POA: Insufficient documentation

## 2016-08-07 DIAGNOSIS — M6281 Muscle weakness (generalized): Secondary | ICD-10-CM | POA: Diagnosis not present

## 2016-08-07 DIAGNOSIS — D696 Thrombocytopenia, unspecified: Secondary | ICD-10-CM

## 2016-08-07 DIAGNOSIS — R278 Other lack of coordination: Secondary | ICD-10-CM | POA: Insufficient documentation

## 2016-08-07 DIAGNOSIS — D693 Immune thrombocytopenic purpura: Secondary | ICD-10-CM | POA: Insufficient documentation

## 2016-08-07 DIAGNOSIS — K7460 Unspecified cirrhosis of liver: Secondary | ICD-10-CM

## 2016-08-07 LAB — FOLATE: Folate: 7.4 ng/mL (ref >5.9)

## 2016-08-07 LAB — CBC WITH DIFFERENTIAL/PLATELET
Basophils Absolute: 0 10*3/uL (ref 0–0.1)
Basophils Relative: 1 %
Eosinophils Absolute: 0 10*3/uL (ref 0–0.7)
Eosinophils Relative: 1 %
HCT: 40.8 % (ref 35.0–47.0)
Hemoglobin: 13.7 g/dL (ref 12.0–16.0)
Lymphocytes Relative: 46 %
Lymphs Abs: 1.6 10*3/uL (ref 1.0–3.6)
MCH: 34.5 pg — ABNORMAL HIGH (ref 26.0–34.0)
MCHC: 33.5 g/dL (ref 32.0–36.0)
MCV: 102.9 fL — ABNORMAL HIGH (ref 80.0–100.0)
Monocytes Absolute: 0.3 10*3/uL (ref 0.2–0.9)
Monocytes Relative: 8 %
Neutro Abs: 1.5 10*3/uL (ref 1.4–6.5)
Neutrophils Relative %: 44 %
Platelets: 82 10*3/uL — ABNORMAL LOW (ref 150–440)
RBC: 3.97 MIL/uL (ref 3.80–5.20)
RDW: 14.4 % (ref 11.5–14.5)
WBC: 3.5 10*3/uL — ABNORMAL LOW (ref 3.6–11.0)

## 2016-08-07 NOTE — Patient Instructions (Addendum)
Ways to activate deep core mm  _ proper sitting posture (feet under knees, ankles  _ proper standing posture ( unlock knees) and altuncrossed ernate between feet hip with, semi tandem stance  _ exhale as you lift and sit to stand   Proper body mechanics with getting out of a chair to decrease strain  on back &pelvic floor   Avoid holding your breath when Getting out of the chair:  Scoot to front part of chair chair Heels behind feet, feet are hip width apart, nose over toes  Inhale like you are smelling roses Exhale to stand      __________  [name] are now ready to begin training the deep core muscles system: diaphragm, transverse abdominis, pelvic floor . These muscles must work together as a team.           The key to these exercises to train the brain to coordinate the timing of these muscles and to have them turn on for long periods of time to hold you upright against gravity (especially important if you are on your feet all day).These muscles are postural muscles and play a role stabilizing your spine and bodyweight. By doing these repetitions slowly and correctly instead of doing crunches, you will achieve a flatter belly without a lower pooch. You are also placing your spine in a more neutral position and breathing properly which in turn, decreases your risk for problems related to your pelvic floor, abdominal, and low back such as pelvic organ prolapse, hernias, diastasis recti (separation of superficial muscles), disk herniations, spinal fractures. These exercises set a solid foundation for you to later progress to resistance/ strength training with therabands and weights and return to other typical fitness exercises with a stronger deeper core.  (See Handout)  Do level 1 : 10 reps Do level 2: 10 reps (left and right = 1 rep) x 3 sets , 2 x day Do not progress to level 3 for 4-5 weeks. You know you are ready when you do not have any rocking of pelvis nor arching in your  back

## 2016-08-07 NOTE — Therapy (Signed)
[facility] MAIN [facility] SERVICES [address] [place], [place], [address] Phone: [phone]   Fax:  [phone]  Physical Therapy Evaluation  Patient Details  Name: [name] MRN: [id number] Date of Birth: 1980/12/30 Referring Provider: Bonney Aid   Encounter Date: 08/07/2016    Past Medical History:  Diagnosis Date  . ADD (attention deficit disorder)   . Alcohol abuse   . 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 Cirrhosis (HCC)   . Depression   . Thyroid disease     Past Surgical History:  Procedure Laterality Date  . ABDOMINAL HYSTERECTOMY    . APPENDECTOMY    . CHOLECYSTECTOMY      There were no vitals filed for this visit.       Subjective Assessment - 08/07/16 0916    Subjective Pt reports SUI for the past 2 years with coughing, sneezing, and getting up from chair and getting out of bed.  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 Denied weight lifting currently but has had performed sit-up    Pertinent History Hx of abdominal hysterectomy with additional vaginal surgery 2/2 cervical CA, gallbladder/ appendix removal            [facility] PT Assessment - 08/07/16 0914      Assessment   Medical Diagnosis SUI   Referring Provider Staebler      Precautions   Precautions None     Restrictions   Weight Bearing Restrictions No     Balance Screen   Has the patient fallen in the past 6 months No     Coordination   Gross Motor Movements are Fluid and Coordinated --  chest breathing   Fine Motor Movements are Fluid and Coordinated --  slight pelvic floor contraction, asymmetrical     Sit to Stand   Comments breathholding     Posture/Postural Control   Posture Comments lumbopelvic perturbations with leg movements in hooklying      Strength   Overall Strength Comments 4-/5 hip abd B      Bed Mobility   Bed Mobility --  crunch method                   Pelvic Floor Special Questions - 08/07/16 0934    Diastasis Recti 3 fingers width above umbilicus and below sternum pre and post head lift ( post Tx: 1.5 fingers width pre and post head lift)           [facility] Adult PT Treatment/Exercise - 08/07/16 0914      Manual Therapy   Manual therapy comments quadriped: facial pulling to address abdominal separation                PT Education - 08/07/16 0953    Education provided Yes   Education Details POC, anatomy, physiology, HEP, goals   Person(s) Educated Patient   Methods Explanation;Demonstration;Tactile cues;Verbal cues;Handout   Comprehension Returned demonstration;Verbalized understanding             PT Long Term Goals - 08/07/16 0955      PT LONG TERM GOAL #1   Title Pt will demo proper breathing technique to promote pelvic floor coordination and contractions    Time 1   Period Days   Status Achieved     PT LONG TERM GOAL #2   Title Pt will demo proper standing, sitting, log rolling mechanics to promote  proper deep core co activation to miminize strain on pelvic floor   Time 1   Period Weeks   Status Achieved     PT LONG TERM GOAL #3   Title Pt will demo decreased abdominal sparation from 3 fingers to <1.5 fingers to increase intraabdominal pressure system to promote  pelvic floor strengthening    Time 1   Period Weeks   Status Achieved               Plan - 08/07/16 0953    Clinical Impression Statement Pt is a 36 yo female who complains of SUI with coughing, sneezing, sit to stand, and getting out of bed. Pt 's clincial presentations included deficits that indicate a  poor intraabdominal pressure system assocaited with SUI. These deficits include diastasis recti, dyscoordination and strength of deep core mm, poor body mechanics that place strain on the abdominal and pelvic floor mm. Following today's visits, pt demo'd decreased abdominal separation and IND with HEP to properly  coordinate deep core mm and engage in pelvic floor co-activation with functional activities ( sitting, standing postures, sit to stand, log rolling out of bed). Due to limited coverage by [facility] insurance, pt will be unable to afford more visits out of pocket. Pt has achieved her STG and is ready for d/c at this time.    Rehab Potential Good   PT Frequency One time visit   PT Treatment/Interventions ADLs/Self Care Home Management;Therapeutic activities;Therapeutic exercise;Neuromuscular re-education;Manual techniques;Patient/family education   Consulted and Agree with Plan of Care Patient      Patient will benefit from skilled therapeutic intervention in order to improve the following deficits and impairments:  Decreased range of motion, Decreased safety awareness, Decreased coordination, Postural dysfunction, Decreased scar mobility, Decreased endurance  Visit Diagnosis: Muscle weakness (generalized)  Other lack of coordination     Problem List Patient Active Problem List   Diagnosis Date Noted  . History of hepatitis C 02/24/2016  . Elevated ferritin 02/24/2016  . 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 Controlled substance agreement signed 08/14/2015  . Thrombocytopenia (HCC) 07/05/2015  . Folate deficiency 07/05/2015  . Macrocytosis 06/05/2015  . ANA positive 06/05/2015  . Arthralgia of multiple sites 06/05/2015  . 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 Raynaud's phenomenon without gangrene 06/05/2015  . Chronic right shoulder pain 05/24/2015  . Chronic elbow pain 05/24/2015  . Chronic knee pain 05/24/2015  . 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 Hypothyroidism (acquired) 04/05/2015  . GERD without esophagitis 04/05/2015  . Allergic rhinitis 04/05/2015  . Situational anxiety 04/05/2015  . 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 Liver cirrhosis (HCC) 04/05/2015  . Left lower quadrant pain 04/05/2015  . History of ovarian  cyst 04/05/2015  . ADHD (attention deficit hyperactivity disorder), combined type 04/05/2015  . Alcoholism /alcohol abuse (HCC) 11/21/2012  . Hepatitis C, chronic (HCC) 11/21/2012  . Ovarian cyst 11/21/2012    Mariane Masters ,PT, DPT, E-RYT  08/07/2016, 12:16 PM  [facility] MAIN [facility] SERVICES [address] [place], [place], [address] Phone: [phone]   Fax:  [phone]  Name: [name] MRN: [id number] Date of Birth: July 24, 1980

## 2016-08-09 LAB — METHYLMALONIC ACID, SERUM: Methylmalonic Acid, Quantitative: 60 nmol/L (ref 0–378)

## 2016-08-26 ENCOUNTER — Ambulatory Visit: Payer: Self-pay | Admitting: Obstetrics and Gynecology

## 2016-09-02 ENCOUNTER — Encounter: Payer: Self-pay | Admitting: Hematology and Oncology

## 2016-10-14 ENCOUNTER — Inpatient Hospital Stay: Payer: Medicaid Other

## 2016-10-16 ENCOUNTER — Ambulatory Visit: Payer: Self-pay | Admitting: Hematology and Oncology

## 2016-10-16 ENCOUNTER — Other Ambulatory Visit: Payer: Self-pay

## 2016-10-16 ENCOUNTER — Ambulatory Visit: Payer: Medicaid Other | Admitting: Gastroenterology

## 2016-11-05 ENCOUNTER — Ambulatory Visit
Admission: EM | Admit: 2016-11-05 | Discharge: 2016-11-05 | Disposition: A | Discharge status: Other Acute Inpatient Hospital | Payer: Medicaid Other | Attending: Family Medicine | Admitting: Family Medicine

## 2016-11-05 ENCOUNTER — Encounter: Payer: Self-pay | Admitting: *Deleted

## 2016-11-05 DIAGNOSIS — M79602 Pain in left arm: Secondary | ICD-10-CM

## 2016-11-05 DIAGNOSIS — E039 Hypothyroidism, unspecified: Secondary | ICD-10-CM | POA: Diagnosis not present

## 2016-11-05 DIAGNOSIS — F902 Attention-deficit hyperactivity disorder, combined type: Secondary | ICD-10-CM | POA: Diagnosis not present

## 2016-11-05 DIAGNOSIS — R079 Chest pain, unspecified: Secondary | ICD-10-CM | POA: Insufficient documentation

## 2016-11-05 DIAGNOSIS — E538 Deficiency of other specified B group vitamins: Secondary | ICD-10-CM | POA: Diagnosis not present

## 2016-11-05 DIAGNOSIS — M25511 Pain in right shoulder: Secondary | ICD-10-CM | POA: Insufficient documentation

## 2016-11-05 DIAGNOSIS — I7300 Raynaud's syndrome without gangrene: Secondary | ICD-10-CM | POA: Diagnosis not present

## 2016-11-05 DIAGNOSIS — R1032 Left lower quadrant pain: Secondary | ICD-10-CM | POA: Diagnosis not present

## 2016-11-05 DIAGNOSIS — F329 Major depressive disorder, single episode, unspecified: Secondary | ICD-10-CM | POA: Diagnosis not present

## 2016-11-05 DIAGNOSIS — B182 Chronic viral hepatitis C: Secondary | ICD-10-CM | POA: Insufficient documentation

## 2016-11-05 DIAGNOSIS — F17210 Nicotine dependence, cigarettes, uncomplicated: Secondary | ICD-10-CM | POA: Insufficient documentation

## 2016-11-05 DIAGNOSIS — K219 Gastro-esophageal reflux disease without esophagitis: Secondary | ICD-10-CM | POA: Insufficient documentation

## 2016-11-05 DIAGNOSIS — R42 Dizziness and giddiness: Secondary | ICD-10-CM

## 2016-11-05 DIAGNOSIS — Z880 Allergy status to penicillin: Secondary | ICD-10-CM | POA: Insufficient documentation

## 2016-11-05 DIAGNOSIS — F419 Anxiety disorder, unspecified: Secondary | ICD-10-CM | POA: Insufficient documentation

## 2016-11-05 DIAGNOSIS — D7589 Other specified diseases of blood and blood-forming organs: Secondary | ICD-10-CM | POA: Insufficient documentation

## 2016-11-05 DIAGNOSIS — Z8249 Family history of ischemic heart disease and other diseases of the circulatory system: Secondary | ICD-10-CM | POA: Insufficient documentation

## 2016-11-05 DIAGNOSIS — Z9049 Acquired absence of other specified parts of digestive tract: Secondary | ICD-10-CM | POA: Insufficient documentation

## 2016-11-05 DIAGNOSIS — R0789 Other chest pain: Secondary | ICD-10-CM | POA: Diagnosis not present

## 2016-11-05 DIAGNOSIS — D696 Thrombocytopenia, unspecified: Secondary | ICD-10-CM | POA: Diagnosis not present

## 2016-11-05 DIAGNOSIS — Z79899 Other long term (current) drug therapy: Secondary | ICD-10-CM | POA: Insufficient documentation

## 2016-11-05 DIAGNOSIS — M25569 Pain in unspecified knee: Secondary | ICD-10-CM | POA: Diagnosis not present

## 2016-11-05 DIAGNOSIS — Z809 Family history of malignant neoplasm, unspecified: Secondary | ICD-10-CM | POA: Insufficient documentation

## 2016-11-05 DIAGNOSIS — K7460 Unspecified cirrhosis of liver: Secondary | ICD-10-CM | POA: Insufficient documentation

## 2016-11-05 DIAGNOSIS — F1020 Alcohol dependence, uncomplicated: Secondary | ICD-10-CM | POA: Insufficient documentation

## 2016-11-05 DIAGNOSIS — J309 Allergic rhinitis, unspecified: Secondary | ICD-10-CM | POA: Diagnosis not present

## 2016-11-05 DIAGNOSIS — G8929 Other chronic pain: Secondary | ICD-10-CM | POA: Diagnosis not present

## 2016-11-05 DIAGNOSIS — R9431 Abnormal electrocardiogram [ECG] [EKG]: Secondary | ICD-10-CM | POA: Insufficient documentation

## 2016-11-05 DIAGNOSIS — N83202 Unspecified ovarian cyst, left side: Secondary | ICD-10-CM | POA: Diagnosis not present

## 2016-11-05 DIAGNOSIS — F1390 Sedative, hypnotic, or anxiolytic use, unspecified, uncomplicated: Secondary | ICD-10-CM | POA: Diagnosis not present

## 2016-11-05 DIAGNOSIS — R200 Anesthesia of skin: Secondary | ICD-10-CM | POA: Insufficient documentation

## 2016-11-05 DIAGNOSIS — Z90710 Acquired absence of both cervix and uterus: Secondary | ICD-10-CM | POA: Insufficient documentation

## 2016-11-05 DIAGNOSIS — Z833 Family history of diabetes mellitus: Secondary | ICD-10-CM | POA: Insufficient documentation

## 2016-11-05 NOTE — ED Triage Notes (Signed)
Patient started having left side chest pain radiating to left arm and left side of neck. No previous history of cardiac siddues.

## 2016-11-05 NOTE — ED Provider Notes (Signed)
[facility]    CSN: [id number] Arrival date & time: 11/05/16  1455     History   Chief Complaint Chief Complaint  Patient presents with  . Chest Pain  . Arm Pain    HPI [name] is a 36 y.o. female.   Patient is a 36 year old white female with atypical chest pain and arm pain and arm numbness. She states over the last few days she's had increasing chest pain that comes and goes and arm numbness comes and goes today the arm numbness has been continuous along arm numbness she's had chest pain that goes up her left arm and down her hands. She is unable to tell me exactly how long the chest pains going on the chest pain also going on for a few days as well. There is some history of sudden death in the family but she is unable to really give a clear-cut history she does smoke. She denies any trouble for cholesterol but doesn't state that she remembers being tested for cholesterol. She does smoke states this only vice. She has a history of alcohol abuse chronic prescription visible days pain use cirrhosis depression and ADD. She's had abdominal hysterectomy appendectomy cholecystectomy. She is allergic to  penicillin.   The history is provided by the patient. No language interpreter was used.  Chest Pain  Pain location:  Substernal area Pain radiates to:  L shoulder and L arm Pain severity:  Moderate Onset quality:  Sudden Timing:  Constant Chronicity:  New Relieved by:  Nothing Worsened by:  Nothing Ineffective treatments:  None tried Associated symptoms: dizziness   Associated symptoms: no nausea   Arm Pain  Associated symptoms include chest pain.    Past Medical History:  Diagnosis Date  . ADD (attention deficit disorder)   . Alcohol abuse   . 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 Cirrhosis (HCC)   . Depression   . Thyroid disease     Patient Active Problem List   Diagnosis Date Noted  . History of hepatitis C 02/24/2016  . Elevated  ferritin 02/24/2016  . 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 Controlled substance agreement signed 08/14/2015  . Thrombocytopenia (HCC) 07/05/2015  . Folate deficiency 07/05/2015  . Macrocytosis 06/05/2015  . ANA positive 06/05/2015  . Arthralgia of multiple sites 06/05/2015  . 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 Raynaud's phenomenon without gangrene 06/05/2015  . Chronic right shoulder pain 05/24/2015  . Chronic elbow pain 05/24/2015  . Chronic knee pain 05/24/2015  . 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 Hypothyroidism (acquired) 04/05/2015  . GERD without esophagitis 04/05/2015  . Allergic rhinitis 04/05/2015  . Situational anxiety 04/05/2015  . 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 Liver cirrhosis (HCC) 04/05/2015  . Left lower quadrant pain 04/05/2015  . History of ovarian cyst 04/05/2015  . ADHD (attention deficit hyperactivity disorder), combined type 04/05/2015  . Alcoholism /alcohol abuse (HCC) 11/21/2012  . Hepatitis C, chronic (HCC) 11/21/2012  . Ovarian cyst 11/21/2012    Past Surgical History:  Procedure Laterality Date  . ABDOMINAL HYSTERECTOMY    . APPENDECTOMY    . CHOLECYSTECTOMY      OB History    No data available       Home Medications    Prior to Admission medications   Medication Sig Start Date End Date Taking? Authorizing Provider  folic acid (FOLVITE) 1 MG tablet Take 4 tablets (4 mg total) by mouth daily. 02/23/16  Yes Corcoran,  Melissa C, MD  levothyroxine (SYNTHROID, LEVOTHROID) 25 MCG tablet Take 25 mcg by mouth daily.   Yes [provider]  dextroamphetamine (DEXTROSTAT) 10 MG tablet Take 1.5 tablets (15 mg total) by mouth 2 (two) times daily. Patient not taking: Reported on 07/10/2016 09/12/15   Kerman Passey, MD  diclofenac sodium (VOLTAREN) 1 % GEL Apply 2 g topically daily as needed.    [provider]  potassium chloride (K-DUR) 10 MEQ tablet Take 1 tablet (10  mEq total) by mouth 2 (two) times daily. Patient not taking: Reported on 07/24/2016 07/10/16   Rosey Bath, MD    Family History Family History  Problem Relation Age of Onset  . Hyperlipidemia Mother   . Cancer Father   . Hypertension Father   . Diabetes Father   . Hyperlipidemia Father     Social History Social History  Substance Use Topics  . Smoking status: Current Every Day Smoker    Packs/day: 1.00    Types: Cigarettes    Start date: 12/31/2000  . Smokeless tobacco: Never Used  . Alcohol use 2.4 oz/week    4 Shots of liquor per week     Comment: excessively     Allergies   Penicillins   Review of Systems Review of Systems  Cardiovascular: Positive for chest pain.  Gastrointestinal: Negative for nausea.  Neurological: Positive for dizziness.  All other systems reviewed and are negative.    Physical Exam Triage Vital Signs ED Triage Vitals  Enc Vitals Group     BP 11/05/16 1504 137/88     Pulse Rate 11/05/16 1504 (!) 101     Resp 11/05/16 1504 18     Temp 11/05/16 1504 98 F (36.7 C)     Temp Source 11/05/16 1504 Oral     SpO2 11/05/16 1504 95 %     Weight 11/05/16 1505 130 lb (59 kg)     Height 11/05/16 1505 5' (1.524 m)     Head Circumference --      Peak Flow --      Pain Score 11/05/16 1505 8     Pain Loc --      Pain Edu? --      Excl. in GC? --    No data found.   Updated Vital Signs BP 137/88 (BP Location: Right Arm)   Pulse (!) 101   Temp 98 F (36.7 C) (Oral)   Resp 18   Ht 5' (1.524 m)   Wt 130 lb (59 kg)   SpO2 95%   BMI 25.39 kg/m   Visual Acuity Right Eye Distance:   Left Eye Distance:   Bilateral Distance:    Right Eye Near:   Left Eye Near:    Bilateral Near:     Physical Exam  Constitutional: She is oriented to person, place, and time. She appears well-developed and well-nourished.  HENT:  Head: Normocephalic and atraumatic.  Right Ear: External ear normal.  Left Ear: External ear normal.  Mouth/Throat:  Oropharynx is clear and moist.  Eyes: Pupils are equal, round, and reactive to light.  Neck: Normal range of motion. Neck supple. No tracheal deviation present. No thyromegaly present.  Cardiovascular: Normal rate and regular rhythm.   Pulmonary/Chest: Effort normal and breath sounds normal.  Musculoskeletal: Normal range of motion. She exhibits tenderness.  Neurological: She is alert and oriented to person, place, and time. No cranial nerve deficit.  Skin: Skin is warm.  Psychiatric: She has a normal mood  and affect.  Vitals reviewed.    UC Treatments / Results  Labs (all labs ordered are listed, but only abnormal results are displayed) Labs Reviewed - No data to display  EKG  EKG Interpretation None       ED ECG REPORT I, Makale Pindell H, the attending physician, personally viewed and interpreted this ECG.   Date: 11/05/2016  EKG Time15:13:54  Rate:94  Rhythm: there are no previous tracings available for comparison, nonspecific ST and T waves changes, Nonspecific T wave abnormality  Axis:-19  Intervals:none  ST&T Change: nonspecific changes Radiology No results found.  Procedures Procedures (including critical care time)  Medications Ordered in UC Medications - No data to display   Initial Impression / Assessment and Plan / UC Course  I have reviewed the triage vital signs and the nursing notes.  Pertinent labs & imaging results that were available during my care of the patient were reviewed by me and considered in my medical decision making (see chart for details).   patient with chest pain left arm pain that had tenderness over the left scapula while we initially thought this could possibly be bursitis she started crying complaining her chest still hurting we can't reproduce the chest pain and she cannot really tell me the palpating the person the left shoulder was consistent with the left arm pain and left arm numbness. This point without getting a very concise  strict history I called EMS transport to the hospital her choice EKG was stable  Should be noted that patient has now decided after EMS took her to the truck to leave and signed a release Final Clinical Impressions(s) / UC Diagnoses   Final diagnoses:  Chest pain, unspecified type  Left arm numbness    New Prescriptions New Prescriptions   No medications on file    Note: This dictation was prepared with Dragon dictation along with smaller phrase technology. Any transcriptional errors that result from this process are unintentional.   Hassan RowanWade, Malcomb Gangemi, MD 11/05/16 2034

## 2016-11-11 ENCOUNTER — Telehealth: Payer: Self-pay | Admitting: *Deleted

## 2016-11-11 ENCOUNTER — Inpatient Hospital Stay: Payer: Medicaid Other | Attending: Hematology and Oncology

## 2016-11-11 DIAGNOSIS — M797 Fibromyalgia: Secondary | ICD-10-CM | POA: Insufficient documentation

## 2016-11-11 DIAGNOSIS — K7460 Unspecified cirrhosis of liver: Secondary | ICD-10-CM

## 2016-11-11 DIAGNOSIS — D696 Thrombocytopenia, unspecified: Secondary | ICD-10-CM

## 2016-11-11 DIAGNOSIS — R5382 Chronic fatigue, unspecified: Secondary | ICD-10-CM | POA: Diagnosis not present

## 2016-11-11 DIAGNOSIS — Z79899 Other long term (current) drug therapy: Secondary | ICD-10-CM | POA: Insufficient documentation

## 2016-11-11 DIAGNOSIS — F17210 Nicotine dependence, cigarettes, uncomplicated: Secondary | ICD-10-CM | POA: Insufficient documentation

## 2016-11-11 DIAGNOSIS — Z880 Allergy status to penicillin: Secondary | ICD-10-CM | POA: Insufficient documentation

## 2016-11-11 DIAGNOSIS — K7030 Alcoholic cirrhosis of liver without ascites: Secondary | ICD-10-CM | POA: Diagnosis not present

## 2016-11-11 DIAGNOSIS — G4700 Insomnia, unspecified: Secondary | ICD-10-CM | POA: Insufficient documentation

## 2016-11-11 DIAGNOSIS — E079 Disorder of thyroid, unspecified: Secondary | ICD-10-CM | POA: Insufficient documentation

## 2016-11-11 DIAGNOSIS — D693 Immune thrombocytopenic purpura: Secondary | ICD-10-CM | POA: Insufficient documentation

## 2016-11-11 DIAGNOSIS — R0609 Other forms of dyspnea: Secondary | ICD-10-CM | POA: Diagnosis not present

## 2016-11-11 DIAGNOSIS — E538 Deficiency of other specified B group vitamins: Secondary | ICD-10-CM | POA: Insufficient documentation

## 2016-11-11 DIAGNOSIS — Z8619 Personal history of other infectious and parasitic diseases: Secondary | ICD-10-CM | POA: Insufficient documentation

## 2016-11-11 LAB — CBC WITH DIFFERENTIAL/PLATELET
Basophils Absolute: 0.1 10*3/uL (ref 0–0.1)
Basophils Relative: 2 %
Eosinophils Absolute: 0 10*3/uL (ref 0–0.7)
Eosinophils Relative: 1 %
HCT: 40.1 % (ref 35.0–47.0)
Hemoglobin: 13.4 g/dL (ref 12.0–16.0)
Lymphocytes Relative: 41 %
Lymphs Abs: 1.4 10*3/uL (ref 1.0–3.6)
MCH: 34.9 pg — ABNORMAL HIGH (ref 26.0–34.0)
MCHC: 33.5 g/dL (ref 32.0–36.0)
MCV: 104.3 fL — ABNORMAL HIGH (ref 80.0–100.0)
Monocytes Absolute: 0.2 10*3/uL (ref 0.2–0.9)
Monocytes Relative: 7 %
Neutro Abs: 1.7 10*3/uL (ref 1.4–6.5)
Neutrophils Relative %: 49 %
Platelets: 67 10*3/uL — ABNORMAL LOW (ref 150–440)
RBC: 3.84 MIL/uL (ref 3.80–5.20)
RDW: 14.5 % (ref 11.5–14.5)
WBC: 3.4 10*3/uL — ABNORMAL LOW (ref 3.6–11.0)

## 2016-11-11 LAB — COMPREHENSIVE METABOLIC PANEL
ALT: 47 U/L (ref 14–54)
AST: 152 U/L — ABNORMAL HIGH (ref 15–41)
Albumin: 4 g/dL (ref 3.5–5.0)
Alkaline Phosphatase: 72 U/L (ref 38–126)
Anion gap: 8 (ref 5–15)
BUN: 6 mg/dL (ref 6–20)
CO2: 29 mmol/L (ref 22–32)
Calcium: 8.6 mg/dL — ABNORMAL LOW (ref 8.9–10.3)
Chloride: 98 mmol/L — ABNORMAL LOW (ref 101–111)
Creatinine, Ser: 0.51 mg/dL (ref 0.44–1.00)
GFR calc Af Amer: >60 mL/min (ref >60)
GFR calc non Af Amer: >60 mL/min (ref >60)
Glucose, Bld: 104 mg/dL — ABNORMAL HIGH (ref 65–99)
Potassium: 3.2 mmol/L — ABNORMAL LOW (ref 3.5–5.1)
Sodium: 135 mmol/L (ref 135–145)
Total Bilirubin: 1.1 mg/dL (ref 0.3–1.2)
Total Protein: 7.1 g/dL (ref 6.5–8.1)

## 2016-11-11 LAB — VITAMIN B12: Vitamin B-12: 420 pg/mL (ref 180–914)

## 2016-11-11 LAB — FOLATE: Folate: 3.5 ng/mL — ABNORMAL LOW (ref >5.9)

## 2016-11-11 NOTE — Telephone Encounter (Signed)
-----   Message from Rosey BathMelissa C Corcoran, MD sent at 11/11/2016 12:27 PM EDT ----- Regarding: Please call patient and send labs to PCP  Potassium low.  M  ----- Message ----- From: Interface, [facility] Sent: 11/11/2016  10:52 AM To: Rosey BathMelissa C Corcoran, MD

## 2016-11-11 NOTE — Telephone Encounter (Signed)
Called patient to inform her that her K+ is low.  Encouraged her to eat K+ rich foods.  Also inform her that lab results have been forwarded to her PCP.

## 2016-11-12 ENCOUNTER — Telehealth: Payer: Self-pay | Admitting: *Deleted

## 2016-11-12 NOTE — Telephone Encounter (Signed)
-----   Message from Melissa C Corcoran, MD sent at 11/11/2016  6:18 PM EDT ----- Regarding: Please call patient  Folate is low.  Is she taking her folic acid daily?  How much?  We will need to increase as her level is low.  M  ----- Message ----- From: Interface, Lab In [facility] Sent: 11/11/2016  10:52 AM To: Melissa C Corcoran, MD    

## 2016-11-12 NOTE — Telephone Encounter (Signed)
Attempted to call patient.  No answer and and not voicemail available.

## 2016-11-13 ENCOUNTER — Encounter: Payer: Self-pay | Admitting: Hematology and Oncology

## 2016-11-13 ENCOUNTER — Inpatient Hospital Stay (HOSPITAL_BASED_OUTPATIENT_CLINIC_OR_DEPARTMENT_OTHER): Payer: Medicaid Other | Admitting: Hematology and Oncology

## 2016-11-13 ENCOUNTER — Telehealth: Payer: Self-pay | Admitting: *Deleted

## 2016-11-13 VITALS — BP 126/90 | HR 98 | Temp 97.6°F | Resp 18 | Wt 114.0 lb

## 2016-11-13 DIAGNOSIS — G4700 Insomnia, unspecified: Secondary | ICD-10-CM

## 2016-11-13 DIAGNOSIS — K7460 Unspecified cirrhosis of liver: Secondary | ICD-10-CM

## 2016-11-13 DIAGNOSIS — Z880 Allergy status to penicillin: Secondary | ICD-10-CM

## 2016-11-13 DIAGNOSIS — Z79899 Other long term (current) drug therapy: Secondary | ICD-10-CM | POA: Diagnosis not present

## 2016-11-13 DIAGNOSIS — R0609 Other forms of dyspnea: Secondary | ICD-10-CM | POA: Diagnosis not present

## 2016-11-13 DIAGNOSIS — E538 Deficiency of other specified B group vitamins: Secondary | ICD-10-CM

## 2016-11-13 DIAGNOSIS — K7030 Alcoholic cirrhosis of liver without ascites: Secondary | ICD-10-CM | POA: Diagnosis not present

## 2016-11-13 DIAGNOSIS — D7589 Other specified diseases of blood and blood-forming organs: Secondary | ICD-10-CM

## 2016-11-13 DIAGNOSIS — M797 Fibromyalgia: Secondary | ICD-10-CM | POA: Diagnosis not present

## 2016-11-13 DIAGNOSIS — F17210 Nicotine dependence, cigarettes, uncomplicated: Secondary | ICD-10-CM

## 2016-11-13 DIAGNOSIS — R5382 Chronic fatigue, unspecified: Secondary | ICD-10-CM | POA: Diagnosis not present

## 2016-11-13 DIAGNOSIS — D693 Immune thrombocytopenic purpura: Secondary | ICD-10-CM | POA: Diagnosis not present

## 2016-11-13 DIAGNOSIS — E079 Disorder of thyroid, unspecified: Secondary | ICD-10-CM

## 2016-11-13 DIAGNOSIS — Z8619 Personal history of other infectious and parasitic diseases: Secondary | ICD-10-CM

## 2016-11-13 DIAGNOSIS — D696 Thrombocytopenia, unspecified: Secondary | ICD-10-CM

## 2016-11-13 DIAGNOSIS — R634 Abnormal weight loss: Secondary | ICD-10-CM

## 2016-11-13 NOTE — Telephone Encounter (Signed)
Patient came to clinic today.  She verified she is taking 4 mg folic acid a day.  MD notified per progress note.

## 2016-11-13 NOTE — Telephone Encounter (Signed)
-----   Message from Rosey BathMelissa C Corcoran, MD sent at 11/11/2016  6:18 PM EDT ----- Regarding: Please call patient  Folate is low.  Is she taking her folic acid daily?  How much?  We will need to increase as her level is low.  M  ----- Message ----- From: Interface, [facility] Sent: 11/11/2016  10:52 AM To: Rosey BathMelissa C Corcoran, MD

## 2016-11-13 NOTE — Progress Notes (Signed)
Patient here today for follow up regarding cirrhosis.  Patient states she is taking 4 mg folic acid per day.  She sometimes skips a dose if she is busy.

## 2016-11-13 NOTE — Progress Notes (Signed)
[facility] day:  11/13/2016   Chief Complaint: [name] is a 36 y.o. 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  HPI:  The patient was last seen in the medical oncology clinic on 07/24/2016.  At that time, she stated that she had stopped drinking (07/20/2016).  She felt tired.  Sleep was poor.  We discussed her consult with GI.  It was recommended that she continue every 6 month surveillance of her liver.   Labs on 11/11/2016 revealed a hematocrit of 40.1, hemoglobin 13.4, MCV 104.3, platelets 67,000, white count 3400 with an ANC of 1700.  CMP revealed a potassium of 3.2, albumin 4.0, AST 152, ALT 47, alkaline phosphatase 72, and bilirubin 1.1.  Folate was 3.5 (> 5.9).  B12 was 420.  MMA was 60 on 08/07/2016.  During the interim, she notes "not much is happening". She is working at the [employer] at [employer]. She notes that it is harder in her joint some days. He is eating well (a blotchy chicken, soup, salad, watermelon and pizza) sees. She is lost weight and is "back into her old jeans". Five years ago, she weighed 125 pounds.  Previously she weighed 110 pounds. She missed a few doses of folic acid.   Past Medical History:  Diagnosis Date  . ADD (attention deficit disorder)   . Alcohol abuse   . 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 Cirrhosis ([place])   . Depression   . Thyroid disease     Past Surgical History:  Procedure Laterality Date  . ABDOMINAL HYSTERECTOMY    . APPENDECTOMY    . CHOLECYSTECTOMY      Family History  Problem Relation Age of Onset  . Hyperlipidemia Mother   . Cancer Father   . Hypertension Father   . Diabetes Father   . Hyperlipidemia Father     Social History:  reports that she has been smoking Cigarettes.  She started smoking about 15 years ago. She has been smoking about 1.00 pack per day. She has never used  smokeless tobacco. She reports that she drinks about 2.4 oz of alcohol per week . She reports that she does not use drugs.  She has 2 children (age 70 and 52).  She is working in the Theme park manager at [employer].  The patient has lived in [place] then [place] and is now back in [place].  The patient is alone today.  Allergies:  Allergies  Allergen Reactions  . Penicillins Rash    Current Medications: Current Outpatient Prescriptions  Medication Sig Dispense Refill  . diclofenac sodium (VOLTAREN) 1 % GEL Apply 2 g topically daily as needed.    . folic acid (FOLVITE) 1 MG tablet Take 4 tablets (4 mg total) by mouth daily. 120 tablet 1  . levothyroxine (SYNTHROID, LEVOTHROID) 25 MCG tablet Take 25 mcg by mouth daily.    . potassium chloride (K-DUR) 10 MEQ tablet Take 1 tablet (10 mEq total) by mouth 2 (two) times daily. 6 tablet 0  . dextroamphetamine (DEXTROSTAT) 10 MG tablet Take 1.5 tablets (15 mg total) by mouth 2 (two) times daily. (Patient not taking: Reported on 07/10/2016) 90 tablet 0   No current facility-administered medications for this visit.     Review of Systems:  GENERAL:  Tired.  Poor sleep.  No fevers, sweats.  Weight down 11 pounds. PERFORMANCE STATUS (ECOG):  1 HEENT:  Sore throat.  No visual changes, runny nose, mouth sores or tenderness. Lungs:  Shortness of breath on exertion. No cough.  No hemoptysis. Cardiac:  No chest pain, palpitations, orthopnea, or PND. GI:  No nausea, vomiting, diarrhea, constipation, melena or hematochezia. GU:  No urgency, frequency, dysuria, or hematuria. Musculoskeletal: Diffuse joint pain (small and large joints).  No back pain.  No muscle tenderness. Extremities:  Hair thin.  No pain or swelling. Skin:  No rashes or skin changes. Neuro:  No headache, numbness or weakness, balance or coordination issues. Endocrine:  Thyroid disease on Synthroid.  No diabetes, hot flashes or night sweats. Psych:  No mood changes, depression or  anxiety. Pain:  Joint pain. Review of systems:  All other systems reviewed and found to be negative.  Physical Exam: Blood pressure 126/90, pulse 98, temperature 97.6 F (36.4 C), temperature source Tympanic, resp. rate 18, weight 113 lb 15.7 oz (51.7 kg). GENERAL:  Chronically fatigued appearing woman sitting comfortably in the exam room in no acute distress.   MENTAL STATUS:  Alert and oriented to person, place and time. HEAD:  Blonde/brown hair pulled back.  Facial fullness with erythema.  Normocephalic, atraumatic, face symmetric, no Cushingoid features. EYES:  Blue eyes.  Pupils equal round and reactive to light and accomodation. No conjunctivitis or scleral icterus. ENT: Oropharynx clear without lesion. Upper dentures. Tongue normal. Mucous membranes moist. Left nares pierced. RESPIRATORY: Clear to auscultation without rales, wheezes or rhonchi. CARDIOVASCULAR: Regular rate and rhythm without murmur, rub or gallop. ABDOMEN: Soft, non-tender, with active bowel sounds.  Liver edge 4 fingerbreaths below RCM. Spleen tip palpable. No masses. SKIN: Tattoos on arms and back. No rashes, ulcers or lesions. EXTREMITIES: No edema, no skin discoloration or tenderness. No palpable cords. LYMPH NODES: No palpable cervical, supraclavicular, axillary or inguinal adenopathy  NEUROLOGICAL: Unremarkable. PSYCH: Appropriate.   Appointment on 11/11/2016  Component Date Value Ref Range Status  . Sodium 11/11/2016 135  135 - 145 mmol/L Final  . Potassium 11/11/2016 3.2* 3.5 - 5.1 mmol/L Final  . Chloride 11/11/2016 98* 101 - 111 mmol/L Final  . CO2 11/11/2016 29  22 - 32 mmol/L Final  . Glucose, Bld 11/11/2016 104* 65 - 99 mg/dL Final  . BUN 11/11/2016 6  6 - 20 mg/dL Final  . Creatinine, Ser 11/11/2016 0.51  0.44 - 1.00 mg/dL Final  . Calcium 11/11/2016 8.6* 8.9 - 10.3 mg/dL Final  . Total Protein 11/11/2016 7.1  6.5 - 8.1 g/dL Final  . Albumin 11/11/2016 4.0  3.5 - 5.0 g/dL Final  .  AST 11/11/2016 152* 15 - 41 U/L Final  . ALT 11/11/2016 47  14 - 54 U/L Final  . Alkaline Phosphatase 11/11/2016 72  38 - 126 U/L Final  . Total Bilirubin 11/11/2016 1.1  0.3 - 1.2 mg/dL Final  . GFR calc non Af Amer 11/11/2016 >60  >60 mL/min Final  . GFR calc Af Amer 11/11/2016 >60  >60 mL/min Final   Comment: (NOTE) The eGFR has been calculated using the CKD EPI equation. This calculation has not been validated in all clinical situations. eGFR's persistently <60 mL/min signify possible Chronic Kidney Disease.   . Anion gap 11/11/2016 8  5 - 15 Final  . WBC 11/11/2016 3.4* 3.6 - 11.0 K/uL Final  . RBC 11/11/2016 3.84  3.80 - 5.20 MIL/uL Final  . Hemoglobin 11/11/2016 13.4  12.0 - 16.0 g/dL Final  . HCT 11/11/2016 40.1  35.0 - 47.0 % Final  . MCV  11/11/2016 104.3* 80.0 - 100.0 fL Final  . MCH 11/11/2016 34.9* 26.0 - 34.0 pg Final  . MCHC 11/11/2016 33.5  32.0 - 36.0 g/dL Final  . RDW 11/11/2016 14.5  11.5 - 14.5 % Final  . Platelets 11/11/2016 67* 150 - 440 K/uL Final  . Neutrophils Relative % 11/11/2016 49  % Final  . Neutro Abs 11/11/2016 1.7  1.4 - 6.5 K/uL Final  . Lymphocytes Relative 11/11/2016 41  % Final  . Lymphs Abs 11/11/2016 1.4  1.0 - 3.6 K/uL Final  . Monocytes Relative 11/11/2016 7  % Final  . Monocytes Absolute 11/11/2016 0.2  0.2 - 0.9 K/uL Final  . Eosinophils Relative 11/11/2016 1  % Final  . Eosinophils Absolute 11/11/2016 0.0  0 - 0.7 K/uL Final  . Basophils Relative 11/11/2016 2  % Final  . Basophils Absolute 11/11/2016 0.1  0 - 0.1 K/uL Final  . Folate 11/11/2016 3.5* >5.9 ng/mL Final  . Vitamin B-12 11/11/2016 420  180 - 914 pg/mL Final   Comment: (NOTE) This assay is not validated for testing neonatal or myeloproliferative syndrome specimens for Vitamin B12 levels. Performed at [facility], [place] [address], [place], [place] [address]     Assessment:  [name] is a 36 y.o. 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 Platelet count has ranged between 106,000 - 113,00. She has folate deficiency.  She drinks alcohol (1/2- 1 pint a day) for the past 7 years. She has a history of treated hepatitis C.  She has ahistory of liver cirrhosis for 4-5 years. AFP was normal on 07/05/2015.  She has no history of splenomegaly. Diet is modest. She denies any new medications or herbal products.  Work-up on 06/05/2015 revealed the following normal labs: SPEP, immunoglobulins (IgG, IgA, and IgM), hepatitis B testing, hepatitis C testing,C-ANCA, P-ANCA, beta2-glocoprotein antibodies, anti-cardiolipin antibodies, lupus anticoagulant testing, C3, C4 , and LDH.  ANA was positive on 05/24/2015.  Work-up on 07/05/2015 confirmed folate deficiency.  She has been on daily folic acid.  Current folate dose is 4 mg (persistent low levels on 1, 2, and 3 mg a day).  B12 was low normal.  MMA was 53 (normal), thus ruling a B12 deficiency.  B12 was 420 on 11/11/2016.  She has macrocytic RBC indices secondary to liver disease.  Platelet count in a blue top tube was 96,000.  Normal labs included the following:  PT/INR, PTT, TSH, and free T4.  HIV testing was negative.  Abdominal ultrasound on 01/01/2016 revealed hepatic cirrhosis.  There was no focal hepatic abnormality.  There was no splenomegaly.  RUQ ultrasound on 07/02/2016 revealed heterogeneous hypoechoic region of the right hepatic lobe which may reflect an intrahepatic mass.  Increased hepatic echotexture with subtle surface contour irregularity was consistent with cirrhosis.  Liver MRI on 07/05/2016 revealed geographic pattern of focal fatty infiltration in both right and left hepatic lobes. There was no evidence of hepatic neoplasm or other significant abnormality.  There was no increased iron deposition.  She is s/p hysterectomy.  She has chronic arhralgias.  She saw orthopedics (x-rays were  negative) and rheumatology.  She has been told that she has fibromyalgia.  ANA is positive.  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  Ferritin has been followed: 445 on 12/19/2015, 394 on 02/21/2016, and 303 on 07/10/2016.  Iron saturation was 26% on 12/19/2015, 44% on 02/21/2016, and 38%  on 07/10/2016.  ESR (2) and CRP (<0.8) on 02/21/2016.    Symptomatically, she has diffuse joints complaints.  She has lost 11 pounds in 4 months.  Exam is stable.  Potassium is 3.2.  Folate is 3.5.  Plan: 1.  Review labs from 11/11/2016.  Discuss recurrent low folate. 2.  Increase folic acid. 3.  Encourage not drinking alcohol. 4.  Anticipate abdominal ultrasound on 01/03/2017. 5.  RTC in 1-2 weeks for weight check and labs (CBC with diff, AFP, folate, CMP, TSH). 6.  RTC for MD assessment after liver ultrasound.   Lequita Asal, MD  11/13/2016, 11:36 AM

## 2016-11-21 ENCOUNTER — Ambulatory Visit: Payer: Medicaid Other | Admitting: Gastroenterology

## 2016-11-21 NOTE — Progress Notes (Deleted)
    Primary Care Physician: Etheleen NicksMacDonald, Keri, NP  Primary Gastroenterologist:  Dr. Midge Miniumarren Jazmina Muhlenkamp  No chief complaint on file.   HPI: [name] is a [age] y.o. female here For follow-up of her cirrhosis.  The patient has a extensive history of alcohol abuse.  The patient was seen in the urgent care for chest pain and was found to have low ferritin elevated AST and was recommended to follow up with me. The patient was told to follow up in one month with me back in March.  The patient did not follow through with this.  The patient was also told to take folate supplementation it appears that she did not and was started on folate by the urgent care Department. The patient's last blood test did not show any signs of liver cancer and her alpha-fetoprotein was normal.  Current Outpatient Prescriptions  Medication Sig Dispense Refill  . dextroamphetamine (DEXTROSTAT) 10 MG tablet Take 1.5 tablets (15 mg total) by mouth 2 (two) times daily. (Patient not taking: Reported on 07/10/2016) 90 tablet 0  . diclofenac sodium (VOLTAREN) 1 % GEL Apply 2 g topically daily as needed.    . folic acid (FOLVITE) 1 MG tablet Take 4 tablets (4 mg total) by mouth daily. 120 tablet 1  . levothyroxine (SYNTHROID, LEVOTHROID) 25 MCG tablet Take 25 mcg by mouth daily.    . potassium chloride (K-DUR) 10 MEQ tablet Take 1 tablet (10 mEq total) by mouth 2 (two) times daily. 6 tablet 0   No current facility-administered medications for this visit.     Allergies as of 11/21/2016 - Review Complete 11/13/2016  Allergen Reaction Noted  . Penicillins Rash 03/14/2015    ROS:  General: Negative for anorexia, weight loss, fever, chills, fatigue, weakness. ENT: Negative for hoarseness, difficulty swallowing , nasal congestion. CV: Negative for chest pain, angina, palpitations, dyspnea on exertion, peripheral edema.  Respiratory: Negative for dyspnea at rest, dyspnea on exertion, cough, sputum, wheezing.  GI: See history of present  illness. GU:  Negative for dysuria, hematuria, urinary incontinence, urinary frequency, nocturnal urination.  Endo: Negative for unusual weight change.    Physical Examination:   There were no vitals taken for this visit.  General: Well-nourished, well-developed in no acute distress.  Eyes: No icterus. Conjunctivae pink. Mouth: Oropharyngeal mucosa moist and pink , no lesions erythema or exudate. Lungs: Clear to auscultation bilaterally. Non-labored. Heart: Regular rate and rhythm, no murmurs rubs or gallops.  Abdomen: Bowel sounds are normal, nontender, nondistended, no hepatosplenomegaly or masses, no abdominal bruits or hernia , no rebound or guarding.   Extremities: No lower extremity edema. No clubbing or deformities. Neuro: Alert and oriented x 3.  Grossly intact. Skin: Warm and dry, no jaundice.   Psych: Alert and cooperative, normal mood and affect.  Labs:  ***  Imaging Studies: No results found.  Assessment and Plan:   [name] is a 36 y.o. y/o female ***    Midge Miniumarren Amore Ackman, MD. Clementeen GrahamFACG   Note: This dictation was prepared with Dragon dictation along with smaller phrase technology. Any transcriptional errors that result from this process are unintentional.

## 2016-11-25 ENCOUNTER — Encounter: Payer: Self-pay | Admitting: *Deleted

## 2016-11-25 ENCOUNTER — Inpatient Hospital Stay: Payer: Medicaid Other

## 2016-11-25 DIAGNOSIS — E538 Deficiency of other specified B group vitamins: Secondary | ICD-10-CM

## 2016-11-25 DIAGNOSIS — D696 Thrombocytopenia, unspecified: Secondary | ICD-10-CM

## 2016-11-25 DIAGNOSIS — K7460 Unspecified cirrhosis of liver: Secondary | ICD-10-CM

## 2016-11-25 DIAGNOSIS — D693 Immune thrombocytopenic purpura: Secondary | ICD-10-CM | POA: Diagnosis not present

## 2016-11-25 DIAGNOSIS — D7589 Other specified diseases of blood and blood-forming organs: Secondary | ICD-10-CM

## 2016-11-25 LAB — COMPREHENSIVE METABOLIC PANEL
ALT: 40 U/L (ref 14–54)
AST: 125 U/L — ABNORMAL HIGH (ref 15–41)
Albumin: 4 g/dL (ref 3.5–5.0)
Alkaline Phosphatase: 76 U/L (ref 38–126)
Anion gap: 7 (ref 5–15)
BUN: 7 mg/dL (ref 6–20)
CO2: 29 mmol/L (ref 22–32)
Calcium: 8.6 mg/dL — ABNORMAL LOW (ref 8.9–10.3)
Chloride: 101 mmol/L (ref 101–111)
Creatinine, Ser: 0.56 mg/dL (ref 0.44–1.00)
GFR calc Af Amer: >60 mL/min (ref >60)
GFR calc non Af Amer: >60 mL/min (ref >60)
Glucose, Bld: 105 mg/dL — ABNORMAL HIGH (ref 65–99)
Potassium: 3.5 mmol/L (ref 3.5–5.1)
Sodium: 137 mmol/L (ref 135–145)
Total Bilirubin: 1 mg/dL (ref 0.3–1.2)
Total Protein: 7.2 g/dL (ref 6.5–8.1)

## 2016-11-25 LAB — CBC WITH DIFFERENTIAL/PLATELET
Basophils Absolute: 0.1 10*3/uL (ref 0–0.1)
Basophils Relative: 1 %
Eosinophils Absolute: 0 10*3/uL (ref 0–0.7)
Eosinophils Relative: 1 %
HCT: 39.6 % (ref 35.0–47.0)
Hemoglobin: 13.4 g/dL (ref 12.0–16.0)
Lymphocytes Relative: 48 %
Lymphs Abs: 1.7 10*3/uL (ref 1.0–3.6)
MCH: 35.4 pg — ABNORMAL HIGH (ref 26.0–34.0)
MCHC: 33.8 g/dL (ref 32.0–36.0)
MCV: 104.8 fL — ABNORMAL HIGH (ref 80.0–100.0)
Monocytes Absolute: 0.3 10*3/uL (ref 0.2–0.9)
Monocytes Relative: 8 %
Neutro Abs: 1.5 10*3/uL (ref 1.4–6.5)
Neutrophils Relative %: 42 %
Platelets: 74 10*3/uL — ABNORMAL LOW (ref 150–440)
RBC: 3.78 MIL/uL — ABNORMAL LOW (ref 3.80–5.20)
RDW: 15.1 % — ABNORMAL HIGH (ref 11.5–14.5)
WBC: 3.6 10*3/uL (ref 3.6–11.0)

## 2016-11-25 NOTE — Progress Notes (Signed)
Patient here for lab and weight today. Weight is 52.9 kg

## 2016-11-26 ENCOUNTER — Telehealth: Payer: Self-pay | Admitting: *Deleted

## 2016-11-26 LAB — FOLATE: Folate: 3.9 ng/mL — ABNORMAL LOW (ref >5.9)

## 2016-11-26 LAB — AFP TUMOR MARKER: AFP, Serum, Tumor Marker: 5.8 ng/mL (ref 0.0–8.3)

## 2016-11-26 LAB — TSH: TSH: 1.284 u[IU]/mL (ref 0.350–4.500)

## 2016-11-26 NOTE — Telephone Encounter (Signed)
Called patient and LVM that folate levels are still low.  Asked her to call and confirm her folic acid dosage per day.

## 2016-11-26 NOTE — Telephone Encounter (Signed)
-----   Message from Rosey BathMelissa C Corcoran, MD sent at 11/26/2016  7:24 AM EDT ----- Regarding: Please call patient  Folate is still low.  What dose is she taking and for how long?  M  ----- Message ----- From: [facility], [facility] [place] Sent: 11/25/2016  11:19 AM To: Rosey BathMelissa C Corcoran, MD

## 2016-11-27 ENCOUNTER — Telehealth: Payer: Self-pay | Admitting: *Deleted

## 2016-11-27 NOTE — Telephone Encounter (Signed)
Called patient and discussed low folate levels, patient reports taking folic acid 4 mg daily as ordered for 2 months, dr. Merlene Pullingorcoran informed.  Informed patient that we would call with changes voiced understanding.

## 2016-11-27 NOTE — Telephone Encounter (Signed)
Called patient and instructed her to increase her folic acid to 5 mg daily, voiced understanding, will recheck in 1 month and appt to be called to patient.

## 2016-11-28 ENCOUNTER — Other Ambulatory Visit: Payer: Self-pay | Admitting: *Deleted

## 2016-11-28 DIAGNOSIS — E538 Deficiency of other specified B group vitamins: Secondary | ICD-10-CM

## 2016-11-28 DIAGNOSIS — K7460 Unspecified cirrhosis of liver: Secondary | ICD-10-CM

## 2016-12-15 DIAGNOSIS — R634 Abnormal weight loss: Secondary | ICD-10-CM | POA: Insufficient documentation

## 2016-12-23 ENCOUNTER — Inpatient Hospital Stay: Payer: Medicaid Other | Attending: Hematology and Oncology

## 2016-12-23 ENCOUNTER — Telehealth: Payer: Self-pay | Admitting: Urgent Care

## 2016-12-23 DIAGNOSIS — D693 Immune thrombocytopenic purpura: Secondary | ICD-10-CM | POA: Insufficient documentation

## 2016-12-23 DIAGNOSIS — E538 Deficiency of other specified B group vitamins: Secondary | ICD-10-CM | POA: Insufficient documentation

## 2016-12-23 LAB — FOLATE: Folate: 4.6 ng/mL — ABNORMAL LOW (ref >5.9)

## 2016-12-23 NOTE — Telephone Encounter (Signed)
Called patient to review folate level with her. Patient is currently taking 4mg  of supplemental folate daily. Result continues to be subtherapeutic at 4.6. Patient advised to increase folate by 1mg  daily; total dose will now be 5mg .

## 2016-12-25 ENCOUNTER — Other Ambulatory Visit: Payer: Self-pay

## 2017-01-07 ENCOUNTER — Ambulatory Visit
Admission: RE | Admit: 2017-01-07 | Discharge: 2017-01-07 | Disposition: A | Discharge status: Home / Self Care | Payer: Medicaid Other | Source: Ambulatory Visit | Attending: Hematology and Oncology | Admitting: Hematology and Oncology

## 2017-01-07 DIAGNOSIS — K760 Fatty (change of) liver, not elsewhere classified: Secondary | ICD-10-CM | POA: Insufficient documentation

## 2017-01-07 DIAGNOSIS — K7460 Unspecified cirrhosis of liver: Secondary | ICD-10-CM | POA: Diagnosis not present

## 2017-01-07 DIAGNOSIS — D696 Thrombocytopenia, unspecified: Secondary | ICD-10-CM

## 2017-01-07 DIAGNOSIS — Z9049 Acquired absence of other specified parts of digestive tract: Secondary | ICD-10-CM | POA: Diagnosis not present

## 2017-01-09 ENCOUNTER — Ambulatory Visit: Payer: Medicaid Other | Admitting: Gastroenterology

## 2017-01-14 NOTE — Progress Notes (Signed)
[facility] day:  01/15/2017   Chief Complaint: [name] is a 36 y.o. 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  HPI:  The patient was last seen in the medical oncology clinic on 11/13/2016.  At that time, patient had no complaints. Patient had stopped drinking alcohol as of 07/20/2016. Despite reports that she was eating well, had lost 11 pounds since last being seen in the clinic. TSH was normal at 1.284. CBC on 11/11/2016 revealed a platelet count 67,000.  Folate was 3.5 despite daily supplementation ('4mg'$ ).  B12 was 420.   Labs on 11/25/2016 revealed a WBC of 3600 with an ANC of 1500. Hemoglobin was 13.4, hematocrit 39.6, MCV 104.8, and platelets 74,000.  AFP was 5.8. Folate continued to be subtherapeutic at 3.9. Folate was checked again on 12/23/2016 and the result continued to be subtherapeutic at 4.6 despite patient taking 4 mg folic acid daily. Patient was called on 11/27/2016 and on 12/23/2016 and advised to increase her folic acid to 5 mg daily.  Abdominal ultrasound done on 01/07/2017 revealed no acute abnormality. There was unchanged geographic fatty infiltration of the liver. There was no ascites. Patient went to see Dr. Allen Norris and he "had an emergency and had to cancel the visit". She did not reschedule.  She has not had the recommended EGD, however she is requesting to have it done.   During the interim, she is doing well overall. She is bruising easily.  She denies hematochezia and melena.  She is questioning gross hematuria; not related to menses as patient has had a hysterectomy.  She is having nosebleeds. Patient is drinking alcohol again; states "Im not drinking much. Maybe twice a week". Patient still has not increased her folic acid; currently taking '4mg'$  daily.    Past Medical History:  Diagnosis Date  . ADD (attention deficit  disorder)   . Alcohol abuse   . 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 Cirrhosis ([place])   . Depression   . Thyroid disease     Past Surgical History:  Procedure Laterality Date  . ABDOMINAL HYSTERECTOMY    . APPENDECTOMY    . CHOLECYSTECTOMY      Family History  Problem Relation Age of Onset  . Hyperlipidemia Mother   . Cancer Father   . Hypertension Father   . Diabetes Father   . Hyperlipidemia Father     Social History:  reports that she has been smoking Cigarettes.  She started smoking about 16 years ago. She has been smoking about 1.00 pack per day. She has never used smokeless tobacco. She reports that she drinks about 2.4 oz of alcohol per week . She reports that she does not use drugs.  She has 2 children (age 37 and 29).  She is working in the Theme park manager at [employer].  The patient has lived in [place] then [place] and is now back in [place].  The patient is alone today.  Allergies:  Allergies  Allergen Reactions  . Penicillins Rash    Current Medications: Current Outpatient Prescriptions  Medication Sig Dispense Refill  . dextroamphetamine (DEXTROSTAT) 10 MG tablet Take 1.5 tablets (15 mg total) by mouth 2 (two) times daily. 90 tablet 0  . diclofenac sodium (VOLTAREN) 1 % GEL Apply 2 g topically daily as needed.    . folic acid (FOLVITE) 1 MG tablet Take  4 tablets (4 mg total) by mouth daily. 120 tablet 1  . levothyroxine (SYNTHROID, LEVOTHROID) 25 MCG tablet Take 25 mcg by mouth daily.    . potassium chloride (K-DUR) 10 MEQ tablet Take 1 tablet (10 mEq total) by mouth 2 (two) times daily. 6 tablet 0   No current facility-administered medications for this visit.     Review of Systems:  GENERAL:  Tired.  Poor sleep.  No fevers, sweats.  Weight up 3 pounds. PERFORMANCE STATUS (ECOG):  1 HEENT:  Epistaxis.  No visual changes, runny nose, mouth sores or tenderness. Lungs:  Shortness of breath on exertion. No cough.  No hemoptysis. Cardiac:  No  chest pain, palpitations, orthopnea, or PND. GI:  No nausea, vomiting, diarrhea, constipation, melena or hematochezia. GU:  No urgency, frequency, dysuria.  Questionable hematuria. Musculoskeletal: Diffuse joint pain (small and large joints).  No back pain.  No muscle tenderness. Extremities:  Hair thin.  No pain or swelling. Skin:  No rashes or skin changes. Neuro:  No headache, numbness or weakness, balance or coordination issues. Endocrine:  Thyroid disease on Synthroid.  No diabetes, hot flashes or night sweats. Psych:  No mood changes, depression or anxiety. Pain:  Joint pain. Review of systems:  All other systems reviewed and found to be negative.  Physical Exam: Blood pressure 137/90, pulse (!) 101, temperature (!) 97.2 F (36.2 C), temperature source Tympanic, resp. rate 18, weight 116 lb 10 oz (52.9 kg). GENERAL:  Chronically fatigued appearing woman sitting comfortably in the exam room in no acute distress.   MENTAL STATUS:  Alert and oriented to person, place and time. HEAD:  Blonde/brown hair pulled back.  Facial fullness with erythema.  Normocephalic, atraumatic, face symmetric, no Cushingoid features. EYES:  Blue eyes.  Pupils equal round and reactive to light and accomodation. No conjunctivitis or scleral icterus. ENT: Oropharynx clear without lesion. Upper dentures. Tongue normal. Mucous membranes moist. Left nares pierced. RESPIRATORY: Clear to auscultation without rales, wheezes or rhonchi. CARDIOVASCULAR: Regular rate and rhythm without murmur, rub or gallop. ABDOMEN: Soft, non-tender, with active bowel sounds.  Liver edge 4 fingerbreaths below RCM. Spleen tip palpable. No masses. SKIN: Tattoos on arms and back. No rashes, ulcers or lesions. EXTREMITIES: No edema, no skin discoloration or tenderness. No palpable cords. LYMPH NODES: No palpable cervical, supraclavicular, axillary or inguinal adenopathy  NEUROLOGICAL: Unremarkable. PSYCH: Appropriate.   No  visits with results within 3 Day(s) from this visit.  Latest known visit with results is:  Appointment on 12/25/2016  Component Date Value Ref Range Status  . Folate 12/23/2016 4.6* >5.9 ng/mL Final    Assessment:  [name] is a 36 y.o. 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 Platelet count has ranged between 106,000 - 113,00. She has folate deficiency.  She drinks alcohol (1/2- 1 pint a day) for the past 7 years. She has a history of treated hepatitis C.  She has ahistory of liver cirrhosis for 4-5 years. AFP was normal on 07/05/2015.  She has no history of splenomegaly. Diet is modest. She denies any new medications or herbal products.  Work-up on 06/05/2015 revealed the following normal labs: SPEP, immunoglobulins (IgG, IgA, and IgM), hepatitis B testing, hepatitis C testing,C-ANCA, P-ANCA, beta2-glocoprotein antibodies, anti-cardiolipin antibodies, lupus anticoagulant testing, C3, C4 , and LDH.  ANA was positive on 05/24/2015.  Work-up on 07/05/2015 confirmed folate deficiency.  She has been on daily  folic acid.  Current folate dose is 4 mg (persistent low levels on 1, 2, and 3 mg a day).  B12 was low normal.  MMA was 53 (normal), thus ruling a B12 deficiency.  B12 was 420 on 11/11/2016.  She has macrocytic RBC indices secondary to liver disease.  Platelet count in a blue top tube was 96,000.  Normal labs included the following:  PT/INR, PTT, TSH, and free T4.  HIV testing was negative.  Abdominal ultrasound on 01/01/2016 revealed hepatic cirrhosis.  There was no focal hepatic abnormality.  There was no splenomegaly.  RUQ ultrasound on 07/02/2016 revealed heterogeneous hypoechoic region of the right hepatic lobe which may reflect an intrahepatic mass.  Increased hepatic echotexture with subtle surface contour irregularity was consistent with cirrhosis.  RUQ  ultrasound  on 01/07/2017 revealed no acute abnormality. Previously demonstrated fatty infiltration appears unchanged. There was no ascites. Liver MRI on 07/05/2016 revealed geographic pattern of focal fatty infiltration in both right and left hepatic lobes. There was no evidence of hepatic neoplasm or other significant abnormality.  There was no increased iron deposition.  AFP has been followed:  4.4 on 07/05/2015, 5.1 on 07/10/2016, and 5.8 on 11/25/2016.  She is s/p hysterectomy.  She has chronic arthralgias.  She saw orthopedics (x-rays were negative) and rheumatology.  She has been told that she has fibromyalgia.  ANA is positive.  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  Ferritin has been followed: 445 on 12/19/2015, 394 on 02/21/2016, and 303 on 07/10/2016.  Iron saturation was 26% on 12/19/2015, 44% on 02/21/2016, and 38% on 07/10/2016.  ESR (2) and CRP (<0.8) on 02/21/2016.    Symptomatically, she is bruising easily.  She notes epistaxis and possibly hematuria.  Exam is stable.   Plan: 1.  Labs today: CBC with diff, CMP, PT, PTT, ferritin, UA, guaiac cards x 3, folic acid. 2.  Review recent labs. 3.  Review RUQ ultrasound results. Continued fatty infiltration noted; no ascites.    4.  Discuss recurrent low folate. Patient taking 4 mg folic acid daily. Advised to increase folic acid to 5 mg daily.  5.  Patient wishing to proceed with EGD. Patient has an appointment scheduled with GI in October; asking to see them sooner, however no availability per GI staff.  6.  RTC in 2 weeks for MD assessment and review of labs. 7.  RTC in 1 month for labs (folate)   Honor Loh, NP  01/15/2017, 12:08 PM    I saw and evaluated the patient, participating in the key portions of the service and reviewing pertinent diagnostic studies and records.  I reviewed the nurse practitioner's note and agree with the findings and the plan.  The assessment and plan were discussed with the patient.   Multiple questions were asked by the patient and answered.   Lequita Asal, MD 01/15/2017

## 2017-01-15 ENCOUNTER — Telehealth: Payer: Self-pay | Admitting: Urgent Care

## 2017-01-15 ENCOUNTER — Inpatient Hospital Stay: Payer: Medicaid Other | Attending: Hematology and Oncology | Admitting: Hematology and Oncology

## 2017-01-15 ENCOUNTER — Inpatient Hospital Stay: Payer: Medicaid Other

## 2017-01-15 ENCOUNTER — Other Ambulatory Visit: Payer: Self-pay | Admitting: *Deleted

## 2017-01-15 VITALS — BP 137/90 | HR 101 | Temp 97.2°F | Resp 18 | Wt 116.6 lb

## 2017-01-15 DIAGNOSIS — F17210 Nicotine dependence, cigarettes, uncomplicated: Secondary | ICD-10-CM

## 2017-01-15 DIAGNOSIS — Z8619 Personal history of other infectious and parasitic diseases: Secondary | ICD-10-CM

## 2017-01-15 DIAGNOSIS — K760 Fatty (change of) liver, not elsewhere classified: Secondary | ICD-10-CM | POA: Diagnosis not present

## 2017-01-15 DIAGNOSIS — M797 Fibromyalgia: Secondary | ICD-10-CM

## 2017-01-15 DIAGNOSIS — E538 Deficiency of other specified B group vitamins: Secondary | ICD-10-CM

## 2017-01-15 DIAGNOSIS — E079 Disorder of thyroid, unspecified: Secondary | ICD-10-CM | POA: Diagnosis not present

## 2017-01-15 DIAGNOSIS — R040 Epistaxis: Secondary | ICD-10-CM | POA: Diagnosis not present

## 2017-01-15 DIAGNOSIS — R7989 Other specified abnormal findings of blood chemistry: Secondary | ICD-10-CM

## 2017-01-15 DIAGNOSIS — Z880 Allergy status to penicillin: Secondary | ICD-10-CM

## 2017-01-15 DIAGNOSIS — Z79899 Other long term (current) drug therapy: Secondary | ICD-10-CM | POA: Diagnosis not present

## 2017-01-15 DIAGNOSIS — F1010 Alcohol abuse, uncomplicated: Secondary | ICD-10-CM | POA: Diagnosis not present

## 2017-01-15 DIAGNOSIS — R233 Spontaneous ecchymoses: Secondary | ICD-10-CM

## 2017-01-15 DIAGNOSIS — D696 Thrombocytopenia, unspecified: Secondary | ICD-10-CM

## 2017-01-15 DIAGNOSIS — K7030 Alcoholic cirrhosis of liver without ascites: Secondary | ICD-10-CM | POA: Diagnosis not present

## 2017-01-15 DIAGNOSIS — R310 Gross hematuria: Secondary | ICD-10-CM | POA: Diagnosis not present

## 2017-01-15 DIAGNOSIS — R5383 Other fatigue: Secondary | ICD-10-CM

## 2017-01-15 DIAGNOSIS — D693 Immune thrombocytopenic purpura: Secondary | ICD-10-CM | POA: Diagnosis not present

## 2017-01-15 DIAGNOSIS — R319 Hematuria, unspecified: Secondary | ICD-10-CM

## 2017-01-15 DIAGNOSIS — R238 Other skin changes: Secondary | ICD-10-CM | POA: Diagnosis not present

## 2017-01-15 DIAGNOSIS — R634 Abnormal weight loss: Secondary | ICD-10-CM | POA: Diagnosis not present

## 2017-01-15 DIAGNOSIS — K7460 Unspecified cirrhosis of liver: Secondary | ICD-10-CM

## 2017-01-15 LAB — CBC WITH DIFFERENTIAL/PLATELET
BAND NEUTROPHILS: 0 %
BASOS PCT: 1 %
Basophils Absolute: 0 10*3/uL (ref 0–0.1)
Blasts: 0 %
EOS ABS: 0 10*3/uL (ref 0–0.7)
EOS PCT: 1 %
HEMATOCRIT: 41.5 % (ref 35.0–47.0)
HEMOGLOBIN: 14 g/dL (ref 12.0–16.0)
Lymphocytes Relative: 39 %
Lymphs Abs: 1.8 10*3/uL (ref 1.0–3.6)
MCH: 35.7 pg — ABNORMAL HIGH (ref 26.0–34.0)
MCHC: 33.9 g/dL (ref 32.0–36.0)
MCV: 105.5 fL — AB (ref 80.0–100.0)
METAMYELOCYTES PCT: 0 %
MONO ABS: 0.3 10*3/uL (ref 0.2–0.9)
Monocytes Relative: 7 %
Myelocytes: 0 %
NEUTROS ABS: 2.6 10*3/uL (ref 1.4–6.5)
Neutrophils Relative %: 52 %
OTHER: 0 %
Platelets: 85 10*3/uL — ABNORMAL LOW (ref 150–440)
Promyelocytes Absolute: 0 %
RBC: 3.93 MIL/uL (ref 3.80–5.20)
RDW: 13.8 % (ref 11.5–14.5)
WBC: 4.7 10*3/uL (ref 3.6–11.0)
nRBC: 0 /100 WBC

## 2017-01-15 LAB — URINALYSIS, COMPLETE (UACMP) WITH MICROSCOPIC
BILIRUBIN URINE: NEGATIVE
Glucose, UA: NEGATIVE mg/dL
Hgb urine dipstick: NEGATIVE
KETONES UR: NEGATIVE mg/dL
LEUKOCYTES UA: NEGATIVE
NITRITE: NEGATIVE
PROTEIN: NEGATIVE mg/dL
RBC / HPF: NONE SEEN RBC/hpf (ref 0–5)
Specific Gravity, Urine: 1.01 (ref 1.005–1.030)
pH: 6 (ref 5.0–8.0)

## 2017-01-15 LAB — COMPREHENSIVE METABOLIC PANEL
ALBUMIN: 4.1 g/dL (ref 3.5–5.0)
ALT: 45 U/L (ref 14–54)
AST: 142 U/L — AB (ref 15–41)
Alkaline Phosphatase: 70 U/L (ref 38–126)
Anion gap: 8 (ref 5–15)
BILIRUBIN TOTAL: 1.2 mg/dL (ref 0.3–1.2)
BUN: 6 mg/dL (ref 6–20)
CO2: 28 mmol/L (ref 22–32)
CREATININE: 0.51 mg/dL (ref 0.44–1.00)
Calcium: 8.6 mg/dL — ABNORMAL LOW (ref 8.9–10.3)
Chloride: 103 mmol/L (ref 101–111)
GFR calc Af Amer: >60 mL/min (ref >60)
GLUCOSE: 92 mg/dL (ref 65–99)
POTASSIUM: 3.4 mmol/L — AB (ref 3.5–5.1)
Sodium: 139 mmol/L (ref 135–145)
TOTAL PROTEIN: 7.5 g/dL (ref 6.5–8.1)

## 2017-01-15 LAB — PROTIME-INR
INR: 1
PROTHROMBIN TIME: 13.1 s (ref 11.4–15.2)

## 2017-01-15 LAB — FERRITIN: FERRITIN: 519 ng/mL — AB (ref 11–307)

## 2017-01-15 LAB — FOLATE: FOLATE: 5.2 ng/mL — AB (ref >5.9)

## 2017-01-15 LAB — APTT: aPTT: 31 seconds (ref 24–36)

## 2017-01-15 NOTE — Telephone Encounter (Signed)
Call placed to patient by this NP to advise her of conversation with Dr. Annabell SabalWohl's office. She was asking for her appointment to be moved forward. I called Wohl's office and there is no availability sooner than her scheduled appointment on 02/19/2017. Her consult is not emergent, so I did not ask for her to be worked in. Patient aware and plans on attending appointment as scheduled. Additionally, while on the phone, patient inquiring about lab results. Available lab results reviewed with patient; she was very appreciative. She asked me to let Dr. Merlene Pullingorcoran know that she would plan on returning the guaiac cards next week.

## 2017-01-15 NOTE — Progress Notes (Signed)
Patient states she is bruising easily.  Patient unable to see Dr. Servando SnareWohl - she went to her appointment but the doctor had an emergency and she did not get to see him.

## 2017-01-16 ENCOUNTER — Telehealth: Payer: Self-pay | Admitting: *Deleted

## 2017-01-16 NOTE — Telephone Encounter (Signed)
-----   Message from Rosey BathMelissa C Corcoran, MD sent at 01/16/2017  7:02 AM EDT ----- Regarding: Please call patient  Folate still low (5.2), but improving  ----- Message ----- From: Verlee MonteGray, Bryan E, NP Sent: 01/15/2017   4:50 PM To: Rosey BathMelissa C Corcoran, MD    ----- Message ----- From: Interface, Lab In [facility] Sent: 01/15/2017   1:03 PM To: Verlee MonteBryan E Gray, NP

## 2017-01-16 NOTE — Telephone Encounter (Signed)
Called patient to inform her that her folate is still low but is improving.

## 2017-01-20 ENCOUNTER — Encounter: Payer: Self-pay | Admitting: Hematology and Oncology

## 2017-01-29 ENCOUNTER — Telehealth: Payer: Self-pay | Admitting: *Deleted

## 2017-01-29 ENCOUNTER — Inpatient Hospital Stay: Payer: Medicaid Other | Admitting: Hematology and Oncology

## 2017-01-29 ENCOUNTER — Other Ambulatory Visit: Payer: Self-pay | Admitting: *Deleted

## 2017-01-29 DIAGNOSIS — K7469 Other cirrhosis of liver: Secondary | ICD-10-CM

## 2017-01-29 NOTE — Telephone Encounter (Signed)
Patient called clinic and spoke to receptionist to inquire if she needed to keep appointment today since RN had called with lab results.  Called patient back to let her know per MD she does not have to come in.  However, we will repeat labs (Folate and LFT's)  In a month.  Patient has lab appt for 02-12-17.  Advised to keep that appt.  Verbalized understanding.

## 2017-02-06 ENCOUNTER — Ambulatory Visit
Admission: EM | Admit: 2017-02-06 | Discharge: 2017-02-06 | Disposition: A | Discharge status: Home / Self Care | Payer: Medicaid Other | Attending: Family Medicine | Admitting: Family Medicine

## 2017-02-06 ENCOUNTER — Encounter: Payer: Self-pay | Admitting: *Deleted

## 2017-02-06 ENCOUNTER — Ambulatory Visit: Payer: Medicaid Other

## 2017-02-06 DIAGNOSIS — K219 Gastro-esophageal reflux disease without esophagitis: Secondary | ICD-10-CM | POA: Insufficient documentation

## 2017-02-06 DIAGNOSIS — N83202 Unspecified ovarian cyst, left side: Secondary | ICD-10-CM | POA: Diagnosis not present

## 2017-02-06 DIAGNOSIS — Z880 Allergy status to penicillin: Secondary | ICD-10-CM | POA: Diagnosis not present

## 2017-02-06 DIAGNOSIS — M542 Cervicalgia: Secondary | ICD-10-CM | POA: Insufficient documentation

## 2017-02-06 DIAGNOSIS — F1011 Alcohol abuse, in remission: Secondary | ICD-10-CM | POA: Insufficient documentation

## 2017-02-06 DIAGNOSIS — F17210 Nicotine dependence, cigarettes, uncomplicated: Secondary | ICD-10-CM | POA: Diagnosis not present

## 2017-02-06 DIAGNOSIS — E538 Deficiency of other specified B group vitamins: Secondary | ICD-10-CM | POA: Diagnosis not present

## 2017-02-06 DIAGNOSIS — D696 Thrombocytopenia, unspecified: Secondary | ICD-10-CM | POA: Insufficient documentation

## 2017-02-06 DIAGNOSIS — K7030 Alcoholic cirrhosis of liver without ascites: Secondary | ICD-10-CM | POA: Diagnosis not present

## 2017-02-06 DIAGNOSIS — M25512 Pain in left shoulder: Secondary | ICD-10-CM | POA: Insufficient documentation

## 2017-02-06 DIAGNOSIS — I7300 Raynaud's syndrome without gangrene: Secondary | ICD-10-CM | POA: Insufficient documentation

## 2017-02-06 DIAGNOSIS — R0789 Other chest pain: Secondary | ICD-10-CM | POA: Insufficient documentation

## 2017-02-06 DIAGNOSIS — M5489 Other dorsalgia: Secondary | ICD-10-CM

## 2017-02-06 DIAGNOSIS — E039 Hypothyroidism, unspecified: Secondary | ICD-10-CM | POA: Insufficient documentation

## 2017-02-06 DIAGNOSIS — H9202 Otalgia, left ear: Secondary | ICD-10-CM | POA: Insufficient documentation

## 2017-02-06 DIAGNOSIS — D7589 Other specified diseases of blood and blood-forming organs: Secondary | ICD-10-CM | POA: Diagnosis not present

## 2017-02-06 DIAGNOSIS — Z8541 Personal history of malignant neoplasm of cervix uteri: Secondary | ICD-10-CM | POA: Diagnosis not present

## 2017-02-06 DIAGNOSIS — F329 Major depressive disorder, single episode, unspecified: Secondary | ICD-10-CM | POA: Insufficient documentation

## 2017-02-06 DIAGNOSIS — B182 Chronic viral hepatitis C: Secondary | ICD-10-CM | POA: Insufficient documentation

## 2017-02-06 DIAGNOSIS — R1032 Left lower quadrant pain: Secondary | ICD-10-CM | POA: Insufficient documentation

## 2017-02-06 DIAGNOSIS — R634 Abnormal weight loss: Secondary | ICD-10-CM | POA: Diagnosis not present

## 2017-02-06 DIAGNOSIS — R000 Tachycardia, unspecified: Secondary | ICD-10-CM | POA: Diagnosis not present

## 2017-02-06 DIAGNOSIS — M549 Dorsalgia, unspecified: Secondary | ICD-10-CM | POA: Diagnosis present

## 2017-02-06 MED ORDER — KETOROLAC TROMETHAMINE 60 MG/2ML IM SOLN: 30.0000 mg | Freq: Once | INTRAMUSCULAR | Status: DC

## 2017-02-06 NOTE — ED Provider Notes (Signed)
[facility]    CSN: [id number] Arrival date & time: 02/06/17  1651     History   Chief Complaint Chief Complaint  Patient presents with  . Chest Pain  . Shoulder Pain  . Back Pain    HPI [name] is a 36 y.o. female.   HPI  This a 36 year old female emotionally labile presents with chest pain which she's indicates as substernal Like someone is sticking their foot through my chest". with chest wall tenderness over the anterior chest, sternum and chest wall radiating into her left shoulder./scapula. Worse to breathe deeply. No radiation to her arm or her neck. Is not complaining of shortness of breath. He is afebrile. Sates that this started suddenly while she was at work today approximately 3:00. Has no history of injury. O2 sat is 98%. States that she's had this problem in the past was seen here, EKG did not show any significant findings but was recommended she be transported by EMS to the hospital. He agreed at first but later while being loaded into the ambulance itself refused to go and signed a release. She recovered fully from that episode.  She has a previous history of alcohol abuse with cirrhosis of the liver without ascites. She has thrombocytopenia and hepatitis C. Today she is also complaining of left ear pain and neck pain. She denies any leg swelling or leg tenderness. She is not on birth control pills.       Past Medical History:  Diagnosis Date  . ADD (attention deficit disorder)   . Alcohol abuse   . 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 Cirrhosis ([facility])   . Depression   . Thyroid disease     Patient Active Problem List   Diagnosis Date Noted  . Weight loss 12/15/2016  . History of hepatitis C 02/24/2016  . Elevated ferritin 02/24/2016  . 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 Controlled substance agreement signed 08/14/2015  . Thrombocytopenia ([facility]) 07/05/2015  . Folate deficiency 07/05/2015  .  Macrocytosis 06/05/2015  . ANA positive 06/05/2015  . Arthralgia of multiple sites 06/05/2015  . 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 Raynaud's phenomenon without gangrene 06/05/2015  . Chronic right shoulder pain 05/24/2015  . Chronic elbow pain 05/24/2015  . Chronic knee pain 05/24/2015  . 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 Hypothyroidism (acquired) 04/05/2015  . GERD without esophagitis 04/05/2015  . Allergic rhinitis 04/05/2015  . Situational anxiety 04/05/2015  . 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 Liver cirrhosis ([facility]) 04/05/2015  . Left lower quadrant pain 04/05/2015  . History of ovarian cyst 04/05/2015  . ADHD (attention deficit hyperactivity disorder), combined type 04/05/2015  . Alcoholism /alcohol abuse ([facility]) 11/21/2012  . Hepatitis C, chronic ([facility]) 11/21/2012  . Ovarian cyst 11/21/2012    Past Surgical History:  Procedure Laterality Date  . ABDOMINAL HYSTERECTOMY    . APPENDECTOMY    . CHOLECYSTECTOMY      OB History    No data available       Home Medications    Prior to Admission medications   Medication Sig Start Date End Date Taking? Authorizing Provider  folic acid (FOLVITE) 1 MG tablet Take 4 tablets (4 mg total) by mouth daily. 02/23/16  Yes Corcoran, Ferdie Ping, MD  levothyroxine (SYNTHROID, LEVOTHROID) 25 MCG tablet Take 25 mcg by mouth daily.   Yes [provider]  potassium chloride (K-DUR) 10 MEQ tablet Take 1 tablet (10 mEq total) by mouth  2 (two) times daily. 07/10/16  Yes Rosey Bath, MD  dextroamphetamine (DEXTROSTAT) 10 MG tablet Take 1.5 tablets (15 mg total) by mouth 2 (two) times daily. 09/12/15   Kerman Passey, MD  diclofenac sodium (VOLTAREN) 1 % GEL Apply 2 g topically daily as needed.    [provider]    Family History Family History  Problem Relation Age of Onset  . Hyperlipidemia Mother   . Cancer Father   . Hypertension Father   . Diabetes Father   .  Hyperlipidemia Father     Social History Social History  Substance Use Topics  . Smoking status: Current Every Day Smoker    Packs/day: 0.50    Types: Cigarettes    Start date: 12/31/2000  . Smokeless tobacco: Never Used  . Alcohol use No     Comment: excessively     Allergies   Penicillins   Review of Systems Review of Systems  Constitutional: Positive for activity change. Negative for chills, fatigue and fever.  HENT: Positive for ear pain.   Respiratory: Negative for cough, shortness of breath, wheezing and stridor.   Cardiovascular: Positive for chest pain. Negative for palpitations and leg swelling.  Musculoskeletal: Positive for neck pain.  All other systems reviewed and are negative.    Physical Exam Triage Vital Signs ED Triage Vitals  Enc Vitals Group     BP 02/06/17 1701 (!) 146/87     Pulse Rate 02/06/17 1701 (!) 111     Resp 02/06/17 1701 12     Temp 02/06/17 1701 98.5 F (36.9 C)     Temp Source 02/06/17 1701 Oral     SpO2 02/06/17 1701 98 %     Weight 02/06/17 1702 115 lb (52.2 kg)     Height 02/06/17 1702 5' (1.524 m)     Head Circumference --      Peak Flow --      Pain Score 02/06/17 1702 8     Pain Loc --      Pain Edu? --      Excl. in GC? --    No data found.   Updated Vital Signs BP (!) 146/87 (BP Location: Left Arm)   Pulse (!) 111   Temp 98.5 F (36.9 C) (Oral)   Resp 12   Ht 5' (1.524 m)   Wt 115 lb (52.2 kg)   SpO2 98%   BMI 22.46 kg/m   Visual Acuity Right Eye Distance:   Left Eye Distance:   Bilateral Distance:    Right Eye Near:   Left Eye Near:    Bilateral Near:     Physical Exam  Constitutional: She is oriented to person, place, and time. She appears well-developed and well-nourished. No distress.  HENT:  Head: Normocephalic.  Right Ear: External ear normal.  Left Ear: External ear normal.  Nose: Nose normal.  Mouth/Throat: Oropharynx is clear and moist.  Eyes: Pupils are equal, round, and reactive to  light. Right eye exhibits no discharge. Left eye exhibits no discharge.  Neck: Normal range of motion. Neck supple.  Semination the cervical spine shows tenderness along the paraspinous muscles on the left as well as along the sternocleidomastoid. She has good range of motion of the neck. She also is tenderness over the right sternocleidomastoid as well.  Cardiovascular: Normal rate, regular rhythm, normal heart sounds and intact distal pulses.  Exam reveals no gallop and no friction rub.   No murmur heard. Pulmonary/Chest: Effort normal and  breath sounds normal. No respiratory distress. She has no wheezes. She has no rales. She exhibits tenderness.  Examination of the chest was performed with Herbert Seta, RN as Nature conservation officer. Patient has diffuse tenderness of the brain degrees over the sternum and along the costochondral and lateral chest wall on the left. This does reproduce her symptoms but not fully. Auscultation of the heart showed tachycardia but no murmur gallop or rub is appreciated. Breath sounds are equal bilateral and without any abnormal lung sounds present.  Musculoskeletal: Normal range of motion.  Examination of her legs shows no significant edema there is significant swelling tenderness or warmth.  Lymphadenopathy:    She has no cervical adenopathy.  Neurological: She is alert and oriented to person, place, and time.  Skin: Skin is warm and dry. She is not diaphoretic.  Psychiatric: Her behavior is normal. Judgment and thought content normal.  Patient is emotionally labile with  crying interspersed with the normal conversation.  Nursing note and vitals reviewed.    UC Treatments / Results  Labs (all labs ordered are listed, but only abnormal results are displayed) Labs Reviewed - No data to display  EKG ED ECG REPORT   Date: 02/06/2017  EKG Time: 6:37 PM  Rate:110  Rhythm: sinus tachycardia,  Axis normal  Intervals:none  ST&T Change: No acute  Narrative  Interpretation: Sinus tachycardia with prolonged QT but no acute changes. This was compared to EKG from 11/05/2016.          Radiology Dg Chest 2 View  Result Date: 02/06/2017 CLINICAL DATA:  Pt c/o chest pain and tightness since this afternoon. NKI. No hx surg. Pt states hx of cervical cancer. EXAM: CHEST  2 VIEW COMPARISON:  None. FINDINGS: The heart size and mediastinal contours are within normal limits. Both lungs are clear. The visualized skeletal structures are unremarkable. IMPRESSION: No active cardiopulmonary disease. Electronically Signed   By: Norva Pavlov M.D.   On: 02/06/2017 17:48    Procedures Procedures (including critical care time)  Medications Ordered in UC Medications  ketorolac (TORADOL) injection 30 mg (30 mg Intramuscular Not Given 02/06/17 1818)   Patient refused the Toradol injection  Initial Impression / Assessment and Plan / UC Course  I have reviewed the triage vital signs and the nursing notes.  Pertinent labs & imaging results that were available during my care of the patient were reviewed by me and considered in my medical decision making (see chart for details).     Plan: 1. Test/x-ray results and diagnosis reviewed with patient I have told the patient of the EKG and the chest x-ray not having any significant abnormalities. She does have chest wall tenderness which is very diffuse mostly confined to the left side producing her symptoms. However she states that this is the worst pain and unlike any other that she's ever had. Because of this I recommended a d-dimer to rule out a possible pulmonary embolism and if positive but she would need to have a CT Angio of the chest. It is not performed in our facility. She decided against having the d-dimer performed and wanted instead just to go to the emergency room. I offered her EMS transportation but she also refused and preferred to drive herself there. He preferred to go to [facility] emergency department.  He left our facility in stable condition.  Final Clinical Impressions(s) / UC Diagnoses   Final diagnoses:  Chest wall pain    New Prescriptions Discharge Medication List as of 02/06/2017  6:11 PM       Controlled Substance Prescriptions [place] Controlled Substance Registry consulted? Not Applicable   Lutricia Feil, PA-C 02/06/17 [id number]

## 2017-02-06 NOTE — ED Notes (Signed)
Patient states that she wants to hold off on the Toradol injection for now.

## 2017-02-12 ENCOUNTER — Inpatient Hospital Stay: Payer: Medicaid Other | Attending: Hematology and Oncology

## 2017-02-19 ENCOUNTER — Ambulatory Visit: Payer: Medicaid Other | Admitting: Gastroenterology

## 2017-05-16 ENCOUNTER — Other Ambulatory Visit: Payer: Self-pay | Admitting: Optometry

## 2017-05-16 DIAGNOSIS — H5712 Ocular pain, left eye: Secondary | ICD-10-CM

## 2017-05-16 DIAGNOSIS — H05122 Orbital myositis, left orbit: Secondary | ICD-10-CM

## 2017-05-23 ENCOUNTER — Ambulatory Visit: Payer: Medicaid Other

## 2017-05-23 ENCOUNTER — Ambulatory Visit
Admission: RE | Admit: 2017-05-23 | Discharge: 2017-05-23 | Disposition: A | Discharge status: Home / Self Care | Payer: Medicaid Other | Source: Ambulatory Visit | Attending: Optometry | Admitting: Optometry

## 2017-05-23 DIAGNOSIS — H5712 Ocular pain, left eye: Secondary | ICD-10-CM | POA: Insufficient documentation

## 2017-05-23 DIAGNOSIS — H05122 Orbital myositis, left orbit: Secondary | ICD-10-CM | POA: Diagnosis not present

## 2017-05-30 ENCOUNTER — Ambulatory Visit
Admission: EM | Admit: 2017-05-30 | Discharge: 2017-05-30 | Disposition: A | Discharge status: Home / Self Care | Payer: Medicaid Other | Attending: Family Medicine | Admitting: Family Medicine

## 2017-05-30 ENCOUNTER — Other Ambulatory Visit: Payer: Self-pay

## 2017-05-30 ENCOUNTER — Encounter: Payer: Self-pay | Admitting: Emergency Medicine

## 2017-05-30 DIAGNOSIS — L03113 Cellulitis of right upper limb: Secondary | ICD-10-CM | POA: Diagnosis not present

## 2017-05-30 MED ORDER — SULFAMETHOXAZOLE-TRIMETHOPRIM 800-160 MG PO TABS: 1.0000 | ORAL_TABLET | Freq: Two times a day (BID) | ORAL | 0 refills | Status: DC

## 2017-05-30 NOTE — ED Triage Notes (Signed)
Patient in today c/o rash on right elbow today. Patient states it burns, tingles and itches.

## 2017-05-30 NOTE — ED Provider Notes (Signed)
[facility]    CSN: [id number] Arrival date & time: 05/30/17  1826     History   Chief Complaint Chief Complaint  Patient presents with  . Rash    HPI [name] is a 37 y.o. female.   37 yo female with a c/o a red, painful and itchy rash to the right elbow skin that she noticed today. Patient denies any injuries, falls, fevers, chills. States has not noticed any spider or other insect bite.    The history is provided by the patient.    Past Medical History:  Diagnosis Date  . ADD (attention deficit disorder)   . Alcohol abuse   . 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 Cirrhosis (HCC)   . Depression   . Thyroid disease     Patient Active Problem List   Diagnosis Date Noted  . Weight loss 12/15/2016  . History of hepatitis C 02/24/2016  . Elevated ferritin 02/24/2016  . 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 Controlled substance agreement signed 08/14/2015  . Thrombocytopenia (HCC) 07/05/2015  . Folate deficiency 07/05/2015  . Macrocytosis 06/05/2015  . ANA positive 06/05/2015  . Arthralgia of multiple sites 06/05/2015  . 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 Raynaud's phenomenon without gangrene 06/05/2015  . Chronic right shoulder pain 05/24/2015  . Chronic elbow pain 05/24/2015  . Chronic knee pain 05/24/2015  . 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 Hypothyroidism (acquired) 04/05/2015  . GERD without esophagitis 04/05/2015  . Allergic rhinitis 04/05/2015  . Situational anxiety 04/05/2015  . 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 Liver cirrhosis (HCC) 04/05/2015  . Left lower quadrant pain 04/05/2015  . History of ovarian cyst 04/05/2015  . ADHD (attention deficit hyperactivity disorder), combined type 04/05/2015  . Alcoholism /alcohol abuse (HCC) 11/21/2012  . Hepatitis C, chronic (HCC) 11/21/2012  . Ovarian cyst 11/21/2012    Past Surgical History:  Procedure  Laterality Date  . ABDOMINAL HYSTERECTOMY    . APPENDECTOMY    . CHOLECYSTECTOMY      OB History    No data available       Home Medications    Prior to Admission medications   Medication Sig Start Date End Date Taking? Authorizing Provider  diclofenac sodium (VOLTAREN) 1 % GEL Apply 2 g topically daily as needed.   Yes [provider]  folic acid (FOLVITE) 1 MG tablet Take 4 tablets (4 mg total) by mouth daily. 02/23/16  Yes Corcoran, Ferdie PingMelissa C, MD  LORazepam (ATIVAN) 0.5 MG tablet Take 0.5 mg by mouth 2 (two) times daily.   Yes [provider]  dextroamphetamine (DEXTROSTAT) 10 MG tablet Take 1.5 tablets (15 mg total) by mouth 2 (two) times daily. 09/12/15   Lada, Janit BernMelinda P, MD  levothyroxine (SYNTHROID, LEVOTHROID) 25 MCG tablet Take 25 mcg by mouth daily.    [provider]  potassium chloride (K-DUR) 10 MEQ tablet Take 1 tablet (10 mEq total) by mouth 2 (two) times daily. 07/10/16   Rosey Bathorcoran, Melissa C, MD  sulfamethoxazole-trimethoprim (BACTRIM DS,SEPTRA DS) 800-160 MG tablet Take 1 tablet by mouth 2 (two) times daily. 05/30/17   Payton Mccallumonty, Embry Manrique, MD    Family History Family History  Problem Relation Age of Onset  . Hyperlipidemia Mother   . Cancer Father   . Hypertension Father   . Diabetes Father   . Hyperlipidemia Father     Social History Social History   Tobacco Use  . Smoking status: Current Every Day  Smoker    Packs/day: 0.50    Types: Cigarettes    Start date: 12/31/2000  . Smokeless tobacco: Never Used  Substance Use Topics  . Alcohol use: Yes    Alcohol/week: 2.4 oz    Types: 4 Shots of liquor per week    Comment: excessively  . Drug use: No     Allergies   Penicillins   Review of Systems Review of Systems   Physical Exam Triage Vital Signs ED Triage Vitals  Enc Vitals Group     BP 05/30/17 1834 137/84     Pulse Rate 05/30/17 1834 96     Resp 05/30/17 1834 16     Temp 05/30/17 1834 97.8 F (36.6 C)     Temp  Source 05/30/17 1834 Oral     SpO2 05/30/17 1834 100 %     Weight 05/30/17 1835 121 lb (54.9 kg)     Height 05/30/17 1835 5\' 1"  (1.549 m)     Head Circumference --      Peak Flow --      Pain Score 05/30/17 1835 8     Pain Loc --      Pain Edu? --      Excl. in GC? --    No data found.  Updated Vital Signs BP 137/84 (BP Location: Right Arm)   Pulse 96   Temp 97.8 F (36.6 C) (Oral)   Resp 16   Ht 5\' 1"  (1.549 m)   Wt 121 lb (54.9 kg)   SpO2 100%   BMI 22.86 kg/m   Visual Acuity Right Eye Distance:   Left Eye Distance:   Bilateral Distance:    Right Eye Near:   Left Eye Near:    Bilateral Near:     Physical Exam  Constitutional: She appears well-developed and well-nourished. No distress.  Skin: She is not diaphoretic.  approx 3cm tender, warm with blanchable erythema skin lesion on right elbow; no drainage  Nursing note and vitals reviewed.    UC Treatments / Results  Labs (all labs ordered are listed, but only abnormal results are displayed) Labs Reviewed - No data to display  EKG  EKG Interpretation None       Radiology No results found.  Procedures Procedures (including critical care time)  Medications Ordered in UC Medications - No data to display   Initial Impression / Assessment and Plan / UC Course  I have reviewed the triage vital signs and the nursing notes.  Pertinent labs & imaging results that were available during my care of the patient were reviewed by me and considered in my medical decision making (see chart for details).       Final Clinical Impressions(s) / UC Diagnoses   Final diagnoses:  Cellulitis of right upper extremity  (mild)  ED Discharge Orders        Ordered    sulfamethoxazole-trimethoprim (BACTRIM DS,SEPTRA DS) 800-160 MG tablet  2 times daily     05/30/17 1854     1. diagnosis reviewed with patient 2. rx as per orders above; reviewed possible side effects, interactions, risks and benefits  3.  Recommend supportive treatment with warm compresses to area  4. Follow-up prn if symptoms worsen or don't improve   Controlled Substance Prescriptions [place] Controlled Substance Registry consulted? Not Applicable   Payton Mccallum, MD 05/30/17 [phone]

## 2017-08-20 ENCOUNTER — Encounter: Payer: Self-pay | Admitting: Obstetrics and Gynecology

## 2017-11-19 IMAGING — US US ABDOMEN COMPLETE
1 series · 13 of 25 positions shown · non-contrast
Comparison: 03/15/2015.

CLINICAL DATA: Thrombocytopenia.  History of cirrhosis.

EXAM:
ABDOMEN ULTRASOUND COMPLETE

[Series 1: us abdomen complete · 0.22mm/px · 13 of 118 slices shown]
[im 1/118]
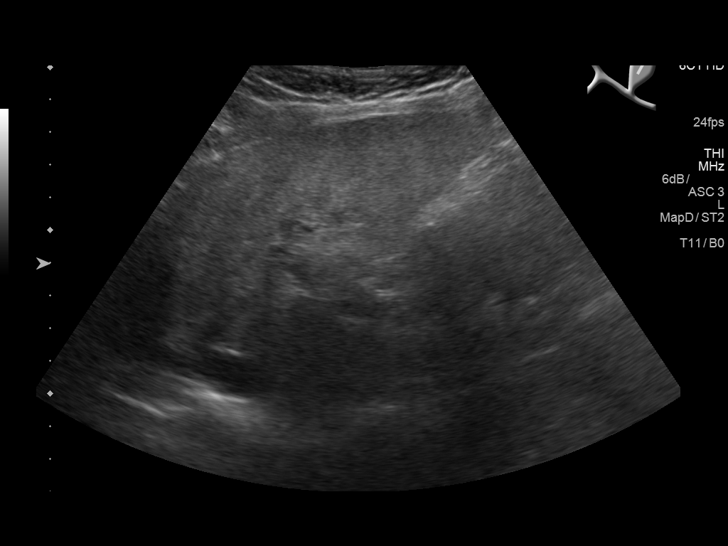
[im 10/118]
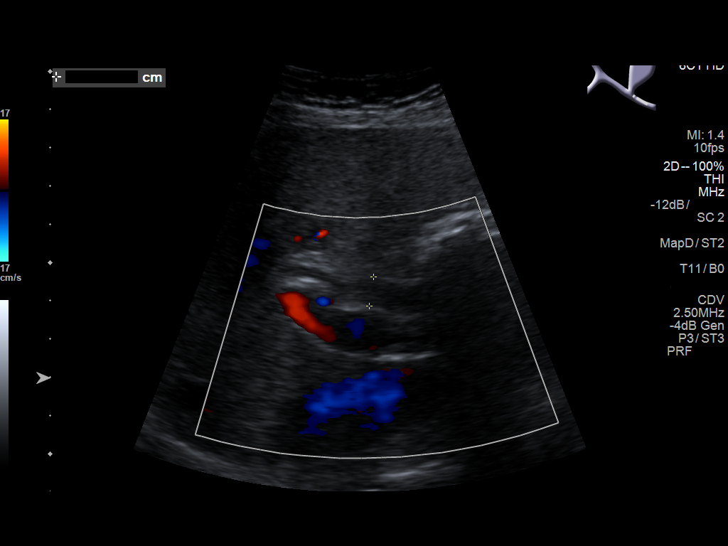
[im 20/118]
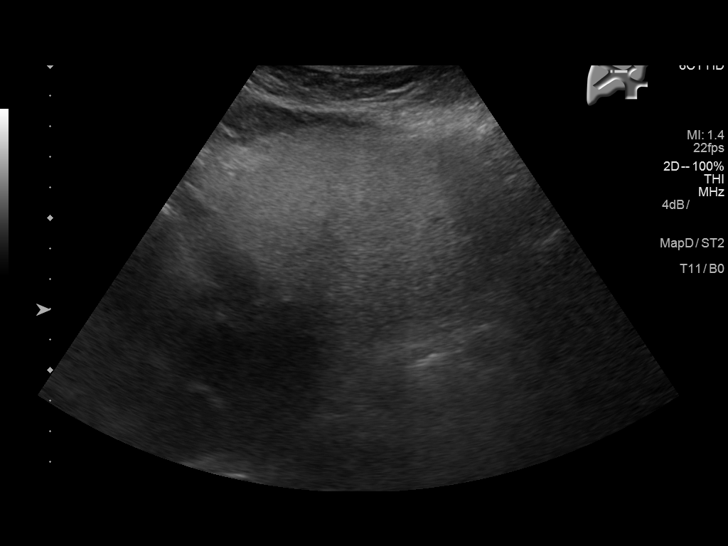
[im 30/118]
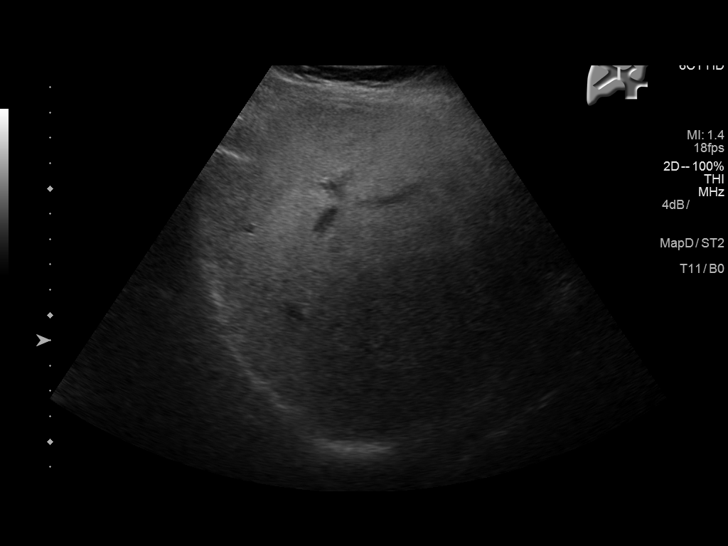
[im 40/118]
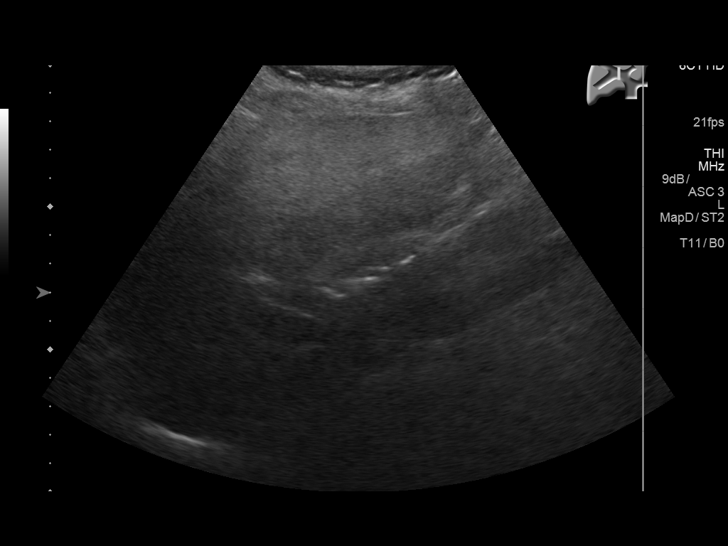
[im 49/118]
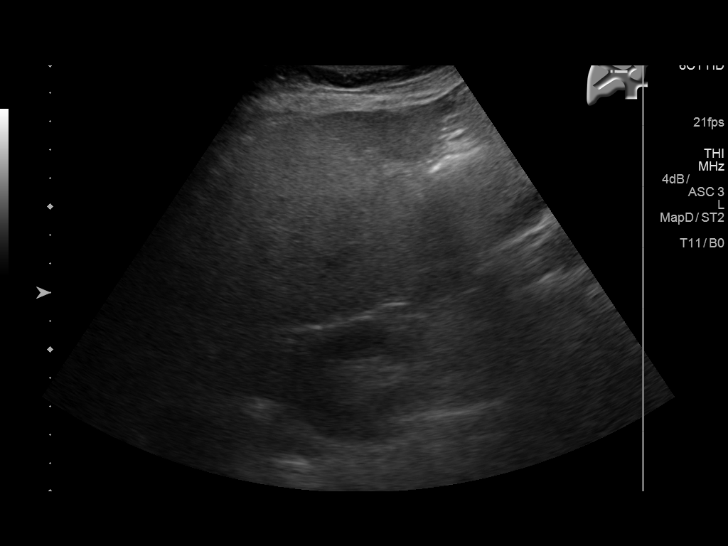
[im 59/118]
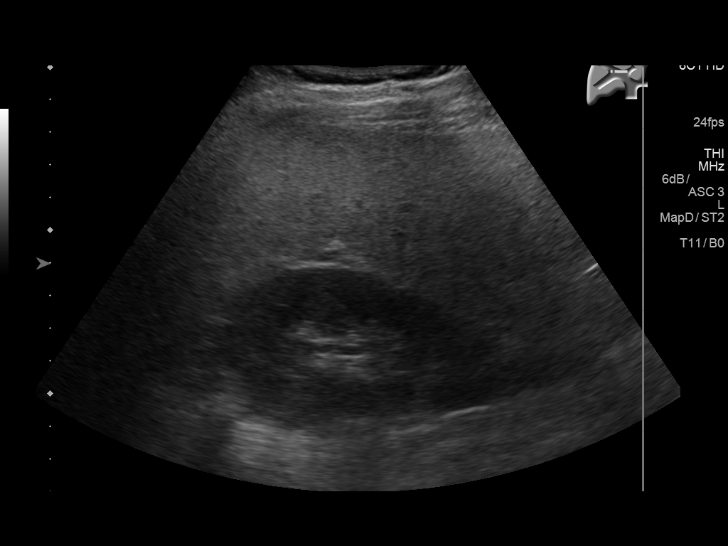
[im 69/118]
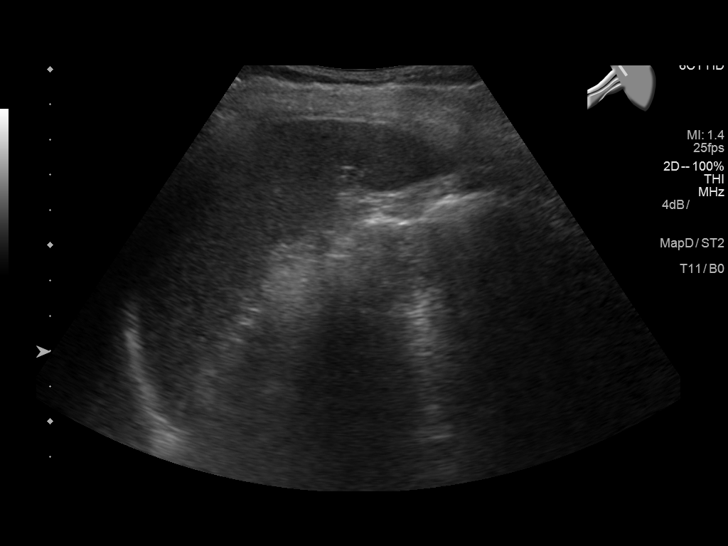
[im 79/118]
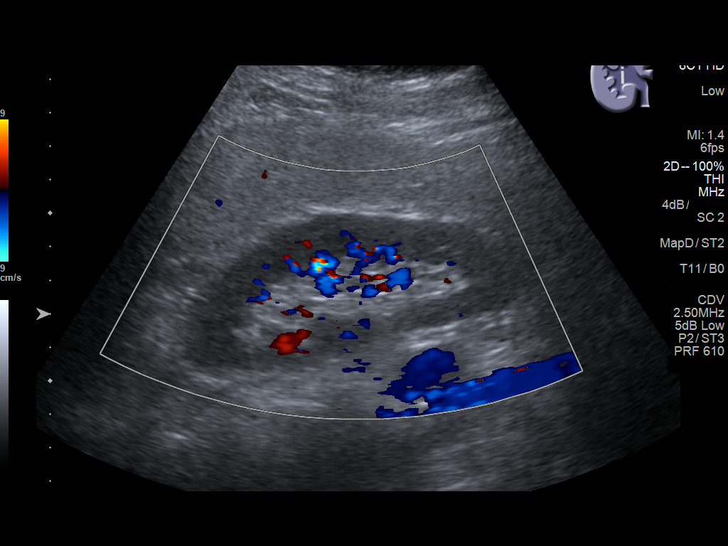
[im 88/118]
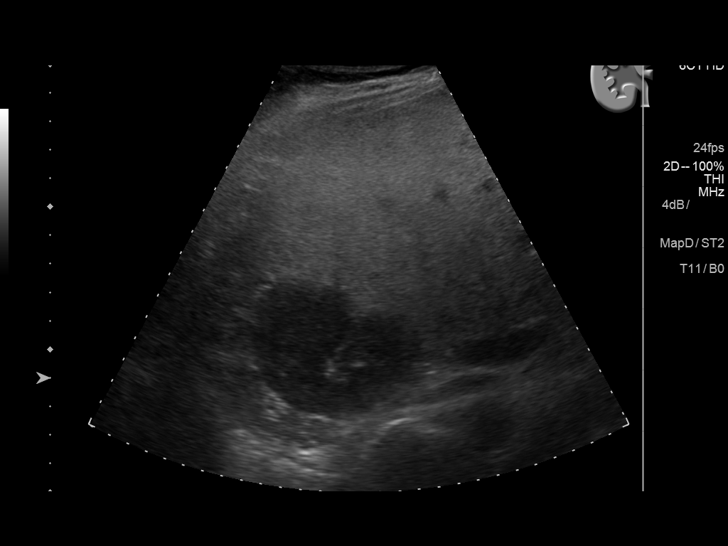
[im 98/118]
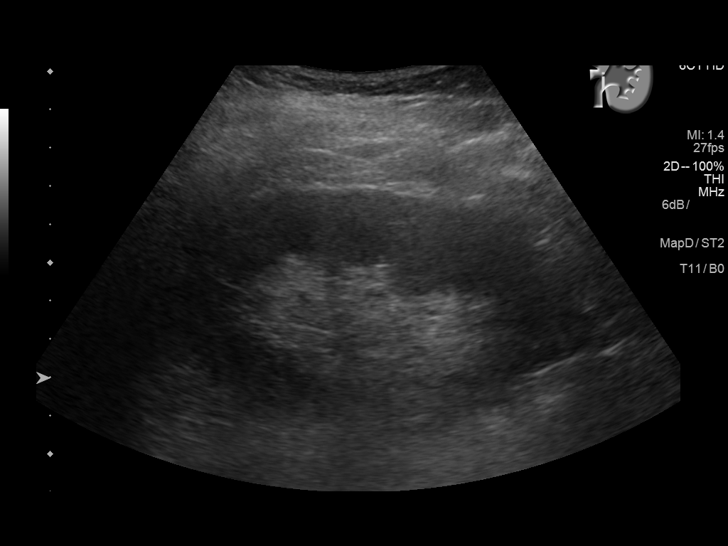
[im 108/118]
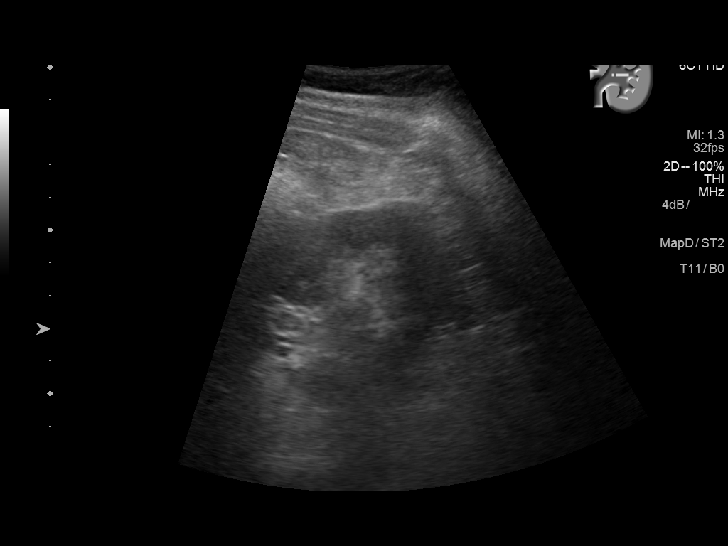
[im 118/118]
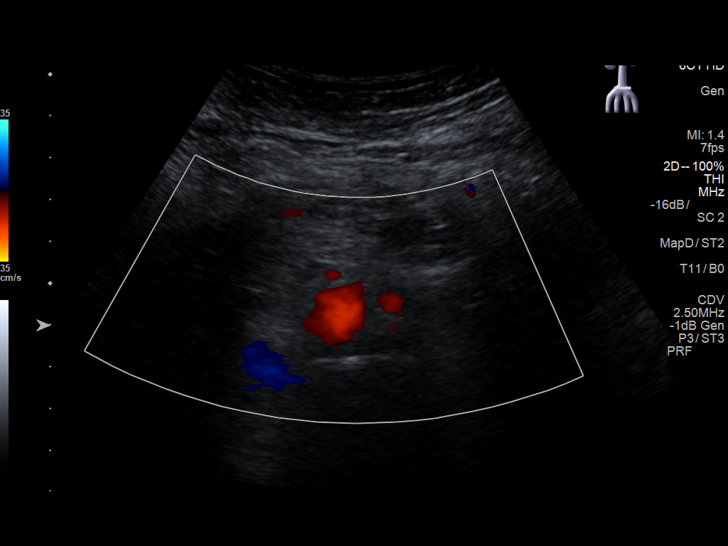

[13 of 25 positions shown; findings below may reference images not displayed]

FINDINGS: Gallbladder: Cholecystectomy.

Common bile duct: Diameter: 7.7 mm. Mild prominence of the common
bile duct can be seen following cholecystectomy. No obstructing
abnormalities identified.

Liver: Hepatic echotexture is coarse and dense with slight contour
irregularity of the liver noted. Findings are consistent with
cirrhosis. Portal vein is patent with normal directional flow. No
focal hepatic abnormality identified.

IVC: No abnormality visualized.

Pancreas: Visualized portion unremarkable.

Spleen: Size and appearance within normal limits.

Right Kidney: Length: 10.5 cm. Echogenicity within normal limits. No
mass or hydronephrosis visualized.

Left Kidney: Length: 10.6 cm. Echogenicity within normal limits. No
mass or hydronephrosis visualized.

Abdominal aorta: No aneurysm visualized.

Other findings: None.
IMPRESSION: 1. Findings consistent with hepatic cirrhosis. No focal hepatic
abnormality identified. Portal vein patent with normal directional
flow. No splenomegaly.

2. Prior cholecystectomy. Mild prominence of the common bile duct is
7.7 mm. This can be seen following cholecystectomy. No obstructing
lesions identified. Correlate with laboratory values.

## 2018-03-22 ENCOUNTER — Ambulatory Visit: Payer: Self-pay

## 2018-03-22 ENCOUNTER — Other Ambulatory Visit: Payer: Self-pay

## 2018-03-22 ENCOUNTER — Ambulatory Visit
Admission: EM | Admit: 2018-03-22 | Discharge: 2018-03-22 | Disposition: A | Discharge status: Home / Self Care | Payer: Self-pay | Attending: Family Medicine | Admitting: Family Medicine

## 2018-03-22 ENCOUNTER — Encounter: Payer: Self-pay | Admitting: Gynecology

## 2018-03-22 DIAGNOSIS — S8992XA Unspecified injury of left lower leg, initial encounter: Secondary | ICD-10-CM | POA: Insufficient documentation

## 2018-03-22 DIAGNOSIS — Z79899 Other long term (current) drug therapy: Secondary | ICD-10-CM | POA: Insufficient documentation

## 2018-03-22 DIAGNOSIS — X58XXXA Exposure to other specified factors, initial encounter: Secondary | ICD-10-CM | POA: Insufficient documentation

## 2018-03-22 DIAGNOSIS — Z791 Long term (current) use of non-steroidal anti-inflammatories (NSAID): Secondary | ICD-10-CM | POA: Insufficient documentation

## 2018-03-22 DIAGNOSIS — I7300 Raynaud's syndrome without gangrene: Secondary | ICD-10-CM | POA: Insufficient documentation

## 2018-03-22 DIAGNOSIS — G8929 Other chronic pain: Secondary | ICD-10-CM | POA: Insufficient documentation

## 2018-03-22 DIAGNOSIS — E039 Hypothyroidism, unspecified: Secondary | ICD-10-CM | POA: Insufficient documentation

## 2018-03-22 DIAGNOSIS — Z8249 Family history of ischemic heart disease and other diseases of the circulatory system: Secondary | ICD-10-CM | POA: Insufficient documentation

## 2018-03-22 DIAGNOSIS — Z880 Allergy status to penicillin: Secondary | ICD-10-CM | POA: Insufficient documentation

## 2018-03-22 DIAGNOSIS — B182 Chronic viral hepatitis C: Secondary | ICD-10-CM | POA: Insufficient documentation

## 2018-03-22 DIAGNOSIS — F17210 Nicotine dependence, cigarettes, uncomplicated: Secondary | ICD-10-CM | POA: Insufficient documentation

## 2018-03-22 DIAGNOSIS — F329 Major depressive disorder, single episode, unspecified: Secondary | ICD-10-CM | POA: Insufficient documentation

## 2018-03-22 DIAGNOSIS — Z79890 Hormone replacement therapy (postmenopausal): Secondary | ICD-10-CM | POA: Insufficient documentation

## 2018-03-22 DIAGNOSIS — M25562 Pain in left knee: Secondary | ICD-10-CM

## 2018-03-22 DIAGNOSIS — K7460 Unspecified cirrhosis of liver: Secondary | ICD-10-CM | POA: Insufficient documentation

## 2018-03-22 MED ORDER — MELOXICAM 15 MG PO TABS: 15.0000 mg | ORAL_TABLET | Freq: Every day | ORAL | 0 refills | Status: DC | PRN

## 2018-03-22 NOTE — ED Triage Notes (Signed)
Per patient c/o refrigerator door at work slammed into her left knee x yesterday. Per patient now with left knee pain.

## 2018-03-22 NOTE — ED Provider Notes (Signed)
[facility]   CSN: [id number] Arrival date & time: 03/22/18  0855   History   Chief Complaint Chief Complaint  Patient presents with  . Knee Injury   HPI  37 year old female presents with a left knee injury.  Patient states that she was at work yesterday.  A coworker opened the refrigerator door abruptly striking her left knee.  Patient reports that she has left knee pain which is quite severe.  Worse with ambulation.  Pain is located around the patella primarily.  No relieving factors.  No other reported symptoms or injury.  No other complaints or concerns at this time.  History reviewed and updated as below.  Past Medical History:  Diagnosis Date  . ADD (attention deficit disorder)   . Alcohol abuse   . 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 Cirrhosis (HCC)   . Depression   . Thyroid disease     Patient Active Problem List   Diagnosis Date Noted  . Weight loss 12/15/2016  . History of hepatitis C 02/24/2016  . Elevated ferritin 02/24/2016  . 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 Controlled substance agreement signed 08/14/2015  . Thrombocytopenia (HCC) 07/05/2015  . Folate deficiency 07/05/2015  . Macrocytosis 06/05/2015  . ANA positive 06/05/2015  . Arthralgia of multiple sites 06/05/2015  . 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 Raynaud's phenomenon without gangrene 06/05/2015  . Chronic right shoulder pain 05/24/2015  . Chronic elbow pain 05/24/2015  . Chronic knee pain 05/24/2015  . 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 Hypothyroidism (acquired) 04/05/2015  . GERD without esophagitis 04/05/2015  . Allergic rhinitis 04/05/2015  . Situational anxiety 04/05/2015  . 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 Liver cirrhosis (HCC) 04/05/2015  . Left lower quadrant pain 04/05/2015  . History of ovarian cyst 04/05/2015  . ADHD (attention deficit hyperactivity disorder), combined type  04/05/2015  . Alcoholism /alcohol abuse (HCC) 11/21/2012  . Hepatitis C, chronic (HCC) 11/21/2012  . Ovarian cyst 11/21/2012    Past Surgical History:  Procedure Laterality Date  . ABDOMINAL HYSTERECTOMY    . APPENDECTOMY    . CHOLECYSTECTOMY      OB History   None      Home Medications    Prior to Admission medications   Medication Sig Start Date End Date Taking? Authorizing Provider  folic acid (FOLVITE) 1 MG tablet Take 4 tablets (4 mg total) by mouth daily. 02/23/16  Yes Corcoran, Ferdie PingMelissa C, MD  levothyroxine (SYNTHROID, LEVOTHROID) 25 MCG tablet Take 25 mcg by mouth daily.   Yes [provider]  LORazepam (ATIVAN) 0.5 MG tablet Take 0.5 mg by mouth 2 (two) times daily.   Yes [provider]  potassium chloride (K-DUR) 10 MEQ tablet Take 1 tablet (10 mEq total) by mouth 2 (two) times daily. 07/10/16  Yes Corcoran, Ferdie PingMelissa C, MD  meloxicam (MOBIC) 15 MG tablet Take 1 tablet (15 mg total) by mouth daily as needed. 03/22/18   Tommie Samsook, Cleva Camero G, DO    Family History Family History  Problem Relation Age of Onset  . Hyperlipidemia Mother   . Cancer Father   . Hypertension Father   . Diabetes Father   . Hyperlipidemia Father     Social History Social History   Tobacco Use  . Smoking status: Current Every Day Smoker    Packs/day: 0.50    Types: Cigarettes    Start date: 12/31/2000  . Smokeless tobacco: Never Used  Substance Use Topics  .  Alcohol use: Yes    Alcohol/week: 4.0 standard drinks    Types: 4 Shots of liquor per week    Comment: excessively  . Drug use: No     Allergies   Penicillins   Review of Systems Review of Systems  Constitutional: Negative.   Musculoskeletal:       Left knee pain/injury.   Physical Exam Triage Vital Signs ED Triage Vitals  Enc Vitals Group     BP 03/22/18 0910 134/76     Pulse Rate 03/22/18 0910 (!) 114     Resp 03/22/18 0910 16     Temp 03/22/18 0910 98.1 F (36.7 C)     Temp Source 03/22/18 0910  Oral     SpO2 03/22/18 0910 100 %     Weight 03/22/18 0911 114 lb (51.7 kg)     Height 03/22/18 0911 5' (1.524 m)     Head Circumference --      Peak Flow --      Pain Score 03/22/18 0911 8     Pain Loc --      Pain Edu? --      Excl. in GC? --    Updated Vital Signs BP 134/76 (BP Location: Left Arm)   Pulse (!) 114   Temp 98.1 F (36.7 C) (Oral)   Resp 16   Ht 5' (1.524 m)   Wt 51.7 kg   SpO2 100%   BMI 22.26 kg/m   Visual Acuity Right Eye Distance:   Left Eye Distance:   Bilateral Distance:    Right Eye Near:   Left Eye Near:    Bilateral Near:     Physical Exam  Constitutional: She is oriented to person, place, and time. She appears well-developed. No distress.  HENT:  Head: Normocephalic and atraumatic.  Pulmonary/Chest: Effort normal. No respiratory distress.  Musculoskeletal:  Left knee -contusion noted.  Patient with diffuse tenderness to palpation.  Decreased range of motion secondary to pain as well.  Ligaments appear to be intact.  Neurological: She is alert and oriented to person, place, and time.  Psychiatric: She has a normal mood and affect. Her behavior is normal.  Nursing note and vitals reviewed.  UC Treatments / Results  Labs (all labs ordered are listed, but only abnormal results are displayed) Labs Reviewed - No data to display  EKG None  Radiology Dg Knee Complete 4 Views Left  Result Date: 03/22/2018 CLINICAL DATA:  Left knee injury with pain.  Lateral knee pain. EXAM: LEFT KNEE - COMPLETE 4+ VIEW COMPARISON:  None. FINDINGS: Left knee is located without fracture or joint effusion. Normal alignment of the left knee. No significant joint space narrowing or degenerative disease. Soft tissues are unremarkable. IMPRESSION: Negative. Electronically Signed   By: Richarda Overlie M.D.   On: 03/22/2018 09:47    Procedures Procedures (including critical care time)  Medications Ordered in UC Medications - No data to display  Initial Impression /  Assessment and Plan / UC Course  I have reviewed the triage vital signs and the nursing notes.  Pertinent labs & imaging results that were available during my care of the patient were reviewed by me and considered in my medical decision making (see chart for details).    37 year old female presents with acute left knee pain.  Secondary to injury.  No fracture on x-ray.  Meloxicam, rest, ice, elevation.  Workmen's Comp. form filled out.  Work note given with restrictions.  Final Clinical Impressions(s) / UC  Diagnoses   Final diagnoses:  Acute pain of left knee     Discharge Instructions     Rest, ice, elevation.  Use the medication as needed.  Take care  Dr. Adriana Simas     ED Prescriptions    Medication Sig Dispense Auth. Provider   meloxicam (MOBIC) 15 MG tablet Take 1 tablet (15 mg total) by mouth daily as needed. 30 tablet Tommie Sams, DO     Controlled Substance Prescriptions [place] Controlled Substance Registry consulted? Not Applicable   Tommie Sams, [place] 03/22/18 1610

## 2018-03-22 NOTE — Discharge Instructions (Signed)
Rest, ice, elevation. ° °Use the medication as needed. ° °Take care ° °Dr. Esperanza Madrazo  °

## 2018-12-15 ENCOUNTER — Telehealth: Payer: Self-pay

## 2018-12-15 NOTE — Telephone Encounter (Signed)
Called patient and scheduled an appointment to Dr. Tamala Julian.

## 2019-01-13 ENCOUNTER — Ambulatory Visit (INDEPENDENT_AMBULATORY_CARE_PROVIDER_SITE_OTHER): Payer: Medicaid Other | Admitting: Family Medicine

## 2019-01-13 ENCOUNTER — Encounter: Payer: Self-pay | Admitting: Family Medicine

## 2019-01-13 ENCOUNTER — Ambulatory Visit: Payer: Self-pay

## 2019-01-13 ENCOUNTER — Other Ambulatory Visit: Payer: Self-pay

## 2019-01-13 VITALS — BP 116/70 | HR 97 | Ht 60.0 in

## 2019-01-13 DIAGNOSIS — M222X2 Patellofemoral disorders, left knee: Secondary | ICD-10-CM | POA: Diagnosis not present

## 2019-01-13 DIAGNOSIS — G8929 Other chronic pain: Secondary | ICD-10-CM | POA: Diagnosis not present

## 2019-01-13 DIAGNOSIS — M25562 Pain in left knee: Secondary | ICD-10-CM

## 2019-01-13 NOTE — Patient Instructions (Signed)
Good to see you.  pennsaid pinkie amount topically 2 times daily as needed.  Exercise 3 times a week Vitamin D 2000 IUs daily and add a B complex See me again in 4-5 weeks

## 2019-01-13 NOTE — Assessment & Plan Note (Signed)
Patellofemoral syndrome.  Patient does have many chronic problems noted.  Patient has many comorbidities that make treatment somewhat difficult.  Home exercises given and work with Product/process development scientist, discussed topical anti-inflammatories given trial, discussed icing regimen.  Discussed over-the-counter medications are to be beneficial.  Follow-up again in 4 weeks.  If continued to have pain consider physical therapy

## 2019-01-13 NOTE — Progress Notes (Signed)
[facility] [place], [place] [address] Phone: [phone] Subjective:   I [name] am serving as a Education administrator for Dr. Hulan Saas.  I'm seeing this patient by the request  of:    CC: Left knee pain  PIR:[id number]  [name] is a 38 y.o. female coming in with complaint of left knee pain. Sometimes she has radiating pain up and down the leg. States she can barely put any pressure on the knee without pain. States there is fluid in the knee. Also having issues with the left ankle.   Onset- 1 month  Location - knee cap  Character- achy and sometimes throbbing, hot "on fire" Aggravating factors- sitting for long periods of time and then getting up Therapies tried- ice, elevation, icy hot, deep blue Severity- 7/10 sometimes 8/10     Past Medical History:  Diagnosis Date  . ADD (attention deficit disorder)   . Alcohol abuse   . 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 Cirrhosis ([place])   . Depression   . Thyroid disease    Past Surgical History:  Procedure Laterality Date  . ABDOMINAL HYSTERECTOMY    . APPENDECTOMY    . CHOLECYSTECTOMY     Social History   Socioeconomic History  . Marital status: Widowed    Spouse name: Not on file  . Number of children: Not on file  . Years of education: Not on file  . Highest education level: Not on file  Occupational History  . Not on file  Social Needs  . Financial resource strain: Not on file  . Food insecurity    Worry: Not on file    Inability: Not on file  . Transportation needs    Medical: Not on file    Non-medical: Not on file  Tobacco Use  . Smoking status: Current Every Day Smoker    Packs/day: 0.50    Types: Cigarettes    Start date: 12/31/2000  . Smokeless tobacco: Never Used  Substance and Sexual Activity  . Alcohol use: Yes    Alcohol/week: 4.0 standard drinks    Types: 4 Shots of liquor per week    Comment: excessively  . Drug use: No  . Sexual  activity: Not Currently  Lifestyle  . Physical activity    Days per week: Not on file    Minutes per session: Not on file  . Stress: Not on file  Relationships  . Social Herbalist on phone: Not on file    Gets together: Not on file    Attends religious service: Not on file    Active member of club or organization: Not on file    Attends meetings of clubs or organizations: Not on file    Relationship status: Not on file  Other Topics Concern  . Not on file  Social History Narrative  . Not on file   Allergies  Allergen Reactions  . Penicillins Rash   Family History  Problem Relation Age of Onset  . Hyperlipidemia Mother   . Cancer Father   . Hypertension Father   . Diabetes Father   . Hyperlipidemia Father     Current Outpatient Medications (Endocrine & Metabolic):  .  levothyroxine (SYNTHROID, LEVOTHROID) 25 MCG tablet, Take 25 mcg by mouth daily.    Current Outpatient Medications (Analgesics):  .  meloxicam (MOBIC) 15 MG tablet, Take 1 tablet (15 mg total) by mouth daily as needed.  Current Outpatient  Medications (Hematological):  .  folic acid (FOLVITE) 1 MG tablet, Take 4 tablets (4 mg total) by mouth daily.  Current Outpatient Medications (Other):  Marland Kitchen.  LORazepam (ATIVAN) 0.5 MG tablet, Take 0.5 mg by mouth 2 (two) times daily. .  potassium chloride (K-DUR) 10 MEQ tablet, Take 1 tablet (10 mEq total) by mouth 2 (two) times daily.    Past medical history, social, surgical and family history all reviewed in electronic medical record.  No pertanent information unless stated regarding to the chief complaint.   Review of Systems:  No headache, visual changes, nausea, vomiting, diarrhea, constipation, dizziness, abdominal pain, skin rash, fevers, chills, night sweats, weight loss, swollen lymph nodes,  chest pain, shortness of breath, mood changes.  Positive muscle aches, body aches  Objective  Blood pressure 116/70, pulse 97, height 5' (1.524 m), SpO2 99  %.    General: No apparent distress alert and oriented x3 mood and affect normal, dressed appropriately.  HEENT: Pupils equal, extraocular movements intact  Respiratory: Patient's speak in full sentences and does not appear short of breath  Cardiovascular: Trace lower extremity edema, non tender, no erythema  Skin: Warm dry intact with no signs of infection or rash on extremities or on axial skeleton.  Abdomen: Soft nontender  Neuro: Cranial nerves II through XII are intact, neurovascularly intact in all extremities with 2+ DTRs and 2+ pulses.  Lymph: No lymphadenopathy of posterior or anterior cervical chain or axillae bilaterally.  Gait antalgic MSK: Trace tender with full range of motion and good stability and symmetric strength and tone of shoulders, elbows, wrist, hip, and ankles bilaterally.   Knee exam does show lateral tracking of the patella left greater than right.  Patient does have good stability noted of the knee today.  Mild crepitus noted.  Limited musculoskeletal ultrasound was performed and interpreted by Judi SaaZachary M Shyhiem Beeney  Limited ultrasound shows the patient has some popliteal tendon hypoechoic changes but otherwise meniscus appear to be unremarkable.  Patient does have some atrophy of the vastus lateralis muscle bilaterally with what appears to be lipid deposits.  [id number]; 15 additional minutes spent for Therapeutic exercises as stated in above notes.  This included exercises focusing on stretching, strengthening, with significant focus on eccentric aspects.   Long term goals include an improvement in range of motion, strength, endurance as well as avoiding reinjury. Patient's frequency would include in 1-2 times a day, 3-5 times a week for a duration of 6-12 weeks.  Reviewed anatomy using anatomical model and how PFS occurs.  Given rehab exercises handout for VMO, hip abductors, core, entire kinetic chain including proprioception exercises.  Could benefit from PT, regular  exercise, upright biking, and a PFS knee brace to assist with tracking abnormalities. Proper technique shown and discussed handout in great detail with ATC.  All questions were discussed and answered.       Impression and Recommendations:     This case required medical decision making of moderate complexity. The above documentation has been reviewed and is accurate and complete Judi SaaZachary M Bakary Bramer, DO       Note: This dictation was prepared with Dragon dictation along with smaller phrase technology. Any transcriptional errors that result from this process are unintentional.

## 2019-01-19 ENCOUNTER — Ambulatory Visit: Payer: Medicaid Other | Admitting: Family Medicine

## 2019-02-17 ENCOUNTER — Ambulatory Visit: Payer: Medicaid Other | Admitting: Family Medicine

## 2019-02-24 ENCOUNTER — Other Ambulatory Visit: Payer: Self-pay

## 2019-02-24 ENCOUNTER — Ambulatory Visit (INDEPENDENT_AMBULATORY_CARE_PROVIDER_SITE_OTHER): Payer: Medicaid Other | Admitting: Obstetrics and Gynecology

## 2019-02-24 ENCOUNTER — Encounter: Payer: Self-pay | Admitting: Obstetrics and Gynecology

## 2019-02-24 VITALS — BP 128/82 | HR 89 | Ht 61.0 in | Wt 153.0 lb

## 2019-02-24 DIAGNOSIS — Z1239 Encounter for other screening for malignant neoplasm of breast: Secondary | ICD-10-CM

## 2019-02-24 DIAGNOSIS — Z0000 Encounter for general adult medical examination without abnormal findings: Secondary | ICD-10-CM | POA: Diagnosis not present

## 2019-02-24 DIAGNOSIS — Z01419 Encounter for gynecological examination (general) (routine) without abnormal findings: Secondary | ICD-10-CM

## 2019-02-24 NOTE — Patient Instructions (Signed)
[facility] [address] [place] [address]  [facility] [place]  [address] [place] [address]  Phone: [phone]  

## 2019-02-24 NOTE — Progress Notes (Signed)
Gynecology Annual Exam   PCP: Etheleen Nicks, NP  Chief Complaint:  Chief Complaint  Patient presents with  . Gynecologic Exam    History of Present Illness: Patient is a 38 y.o. No obstetric history on file. presents for annual exam. The patient has no complaints today.   LMP: No LMP recorded. Patient has had a hysterectomy.  The patient is sexually active. She currently uses status post hysterectomy for contraception. She denies dyspareunia.  The patient does perform self breast exams.  There is no notable family history of breast or ovarian cancer in her family.  The patient wears seatbelts: yes.   The patient has regular exercise: not asked.    The patient denies current symptoms of depression.    Recently diagnosed stage IV cirrhosis undergoing transplant work up  Review of Systems: Review of Systems  Constitutional: Positive for malaise/fatigue. Negative for chills and fever.  HENT: Negative for congestion.   Respiratory: Positive for shortness of breath. Negative for cough.   Cardiovascular: Positive for leg swelling. Negative for chest pain and palpitations.  Gastrointestinal: Negative for abdominal pain, constipation, diarrhea, heartburn, nausea and vomiting.  Genitourinary: Negative for dysuria, frequency and urgency.  Skin: Negative for itching and rash.  Neurological: Negative for dizziness and headaches.  Endo/Heme/Allergies: Bruises/bleeds easily.  Psychiatric/Behavioral: Negative for depression.    Past Medical History:  Past Medical History:  Diagnosis Date  . ADD (attention deficit disorder)   . Alcohol abuse   . 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 Cirrhosis (HCC)   . Depression   . Thyroid disease     Past Surgical History:  Past Surgical History:  Procedure Laterality Date  . ABDOMINAL HYSTERECTOMY    . APPENDECTOMY    . CHOLECYSTECTOMY      Gynecologic History:  No LMP recorded. Patient has had a  hysterectomy. Contraception: status post hysterectomy  Obstetric History: No obstetric history on file.  Family History:  Family History  Problem Relation Age of Onset  . Hyperlipidemia Mother   . Cancer Father   . Hypertension Father   . Diabetes Father   . Hyperlipidemia Father     Social History:  Social History   Socioeconomic History  . Marital status: Widowed    Spouse name: Not on file  . Number of children: Not on file  . Years of education: Not on file  . Highest education level: Not on file  Occupational History  . Not on file  Social Needs  . Financial resource strain: Not on file  . Food insecurity    Worry: Not on file    Inability: Not on file  . Transportation needs    Medical: Not on file    Non-medical: Not on file  Tobacco Use  . Smoking status: Current Every Day Smoker    Packs/day: 0.50    Types: Cigarettes    Start date: 12/31/2000  . Smokeless tobacco: Never Used  Substance and Sexual Activity  . Alcohol use: Yes    Alcohol/week: 4.0 standard drinks    Types: 4 Shots of liquor per week    Comment: excessively  . Drug use: No  . Sexual activity: Not Currently  Lifestyle  . Physical activity    Days per week: Not on file    Minutes per session: Not on file  . Stress: Not on file  Relationships  . Social Musician on phone: Not on file    Gets together:  Not on file    Attends religious service: Not on file    Active member of club or organization: Not on file    Attends meetings of clubs or organizations: Not on file    Relationship status: Not on file  . Intimate partner violence    Fear of current or ex partner: Not on file    Emotionally abused: Not on file    Physically abused: Not on file    Forced sexual activity: Not on file  Other Topics Concern  . Not on file  Social History Narrative  . Not on file    Allergies:  Allergies  Allergen Reactions  . Penicillins Rash    Medications: Prior to Admission  medications   Medication Sig Start Date End Date Taking? Authorizing Provider  levothyroxine (SYNTHROID, LEVOTHROID) 25 MCG tablet Take 25 mcg by mouth daily.   Yes [provider]    Physical Exam Vitals: Blood pressure 128/82, pulse 89, height 5\' 1"  (1.549 m), weight 153 lb (69.4 kg).  General: NAD, well nourished, appears stated age 46: normocephalic, mild scleral icterus Thyroid: no enlargement, no palpable nodules Pulmonary: No increased work of breathing, CTAB Cardiovascular: RRR, distal pulses 2+ Breast: Breast symmetrical, no tenderness, no palpable nodules or masses, no skin or nipple retraction present, no nipple discharge.  No axillary or supraclavicular lymphadenopathy. Abdomen: NABS, soft, non-tender, distended.  Umbilicus without lesions.  Genitourinary:  External: Normal external female genitalia.  Normal urethral meatus, normal Bartholin's and Skene's glands.    Vagina: Normal vaginal mucosa, no evidence of prolapse.    Cervix: surgical absent  Uterus:surgically absent  Adnexa: ovaries non-enlarged, no adnexal masses  Rectal: deferred  Lymphatic: no evidence of inguinal lymphadenopathy Extremities: no edema, erythema, or tenderness Neurologic: Grossly intact Psychiatric: mood appropriate, affect full  Female chaperone present for pelvic and breast  portions of the physical exam    Assessment: 38 y.o. No obstetric history on file. routine annual exam  Plan: Problem List Items Addressed This Visit    None    Visit Diagnoses    Encounter for gynecological examination without abnormal finding    -  Primary   Breast screening       Relevant Orders   MS DIGITAL SCREENING TOMO BILATERAL      1) STI screening  was notoffered and therefore not obtained  2)  ASCCP guidelines and rational discussed.  Patient opts for discontinue secondary to prior hysterectomy screening interval  3) Contraception - the patient is currently using  status post  hysterectomy.  She is not currently in need of contraception secondary to being sterile  4) Routine healthcare maintenance including cholesterol, diabetes screening discussed managed by PCP   5) Return in about 1 year (around 02/24/2020) for annual.   Malachy Mood, MD, [place], [facility] 02/24/2019, 10:46 AM

## 2019-03-03 ENCOUNTER — Encounter: Payer: Self-pay | Admitting: Obstetrics and Gynecology

## 2019-03-03 NOTE — Telephone Encounter (Signed)
This encounter was created in error - please disregard.

## 2019-03-03 NOTE — Addendum Note (Signed)
Addended by: Dorthula Nettles on: 03/03/2019 01:49 PM   Modules accepted: Orders

## 2019-04-23 IMAGING — CT CT ORBITS W/O CM
3 of 4 series · 14 of 47 positions shown, 17 images · non-contrast
Comparison: None.

CLINICAL DATA: Blurriness and loss of vision in the left eye. Left
eye pressure. No trauma.

EXAM:
CT ORBITS WITHOUT CONTRAST
TECHNIQUE: Multidetector CT images were obtained using the standard protocol
without intravenous contrast.

[Series 3: orbits 2.0 h30s st · axial · 0.31mm/px · z∈[+270,+350]mm · 9 of 48 slices shown, 12 images]
[im 4/48  brain]
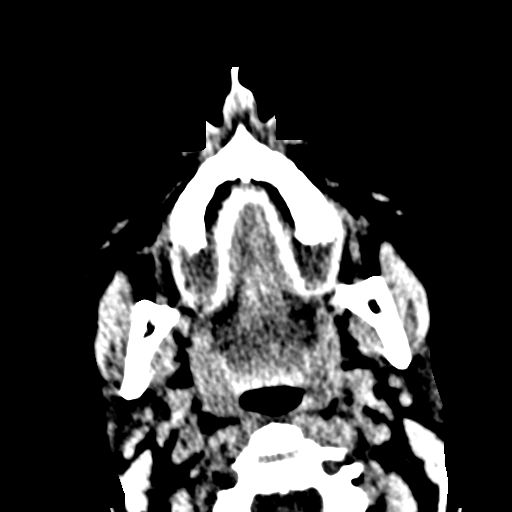
[im 4/48  bone]
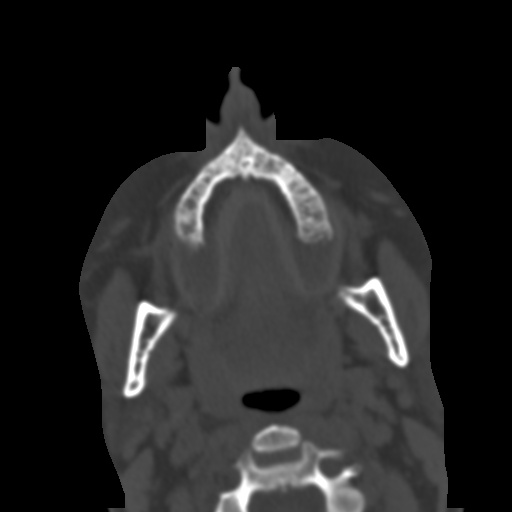
[im 9/48  bone]
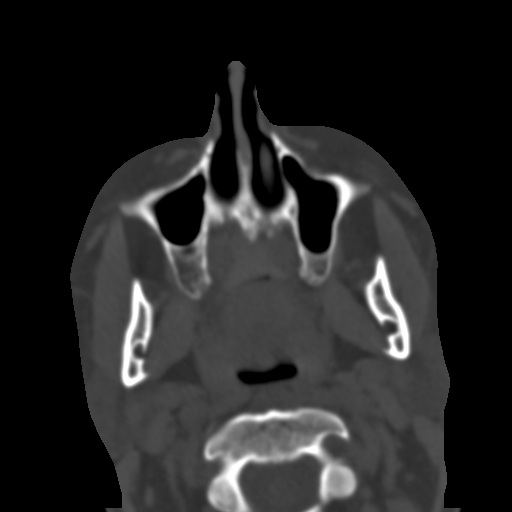
[im 13/48  bone]
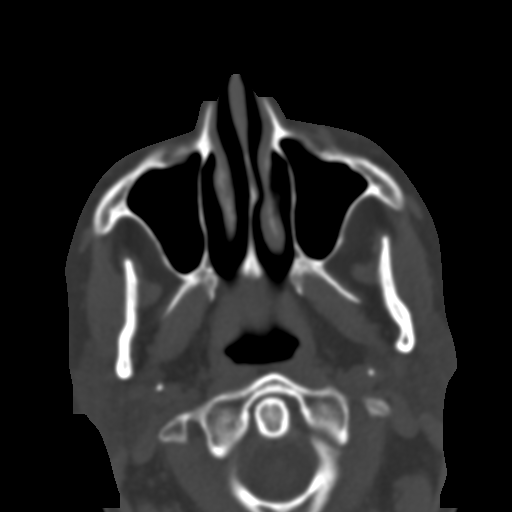
[im 18/48  bone]
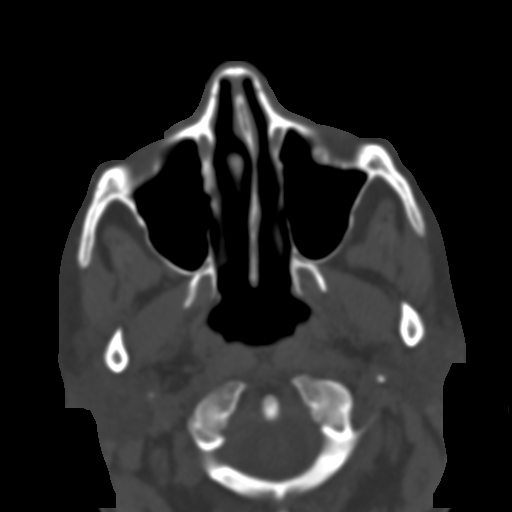
[im 25/48  brain]
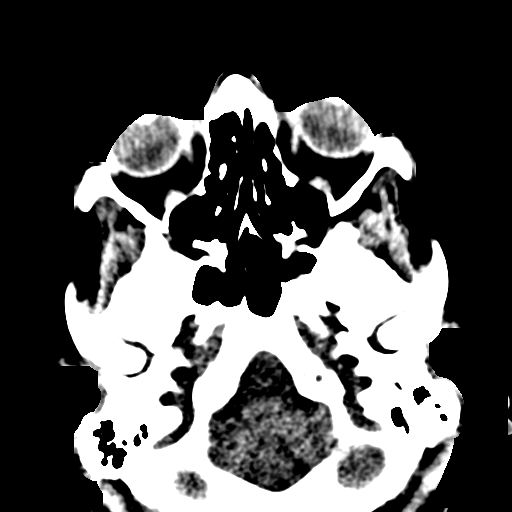
[im 25/48  bone]
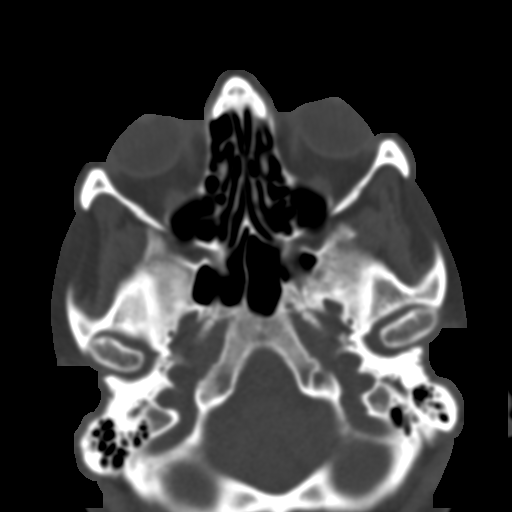
[im 30/48  bone]
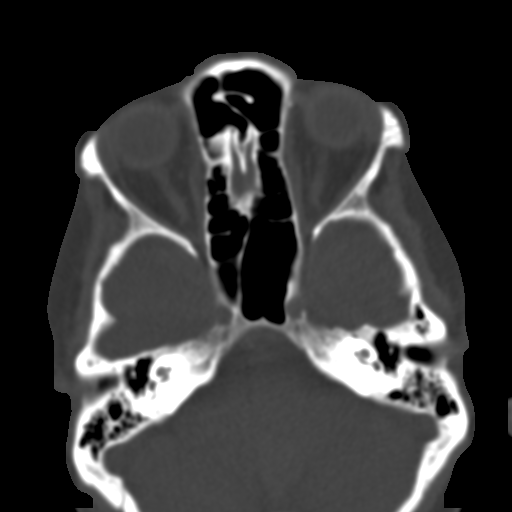
[im 35/48  bone]
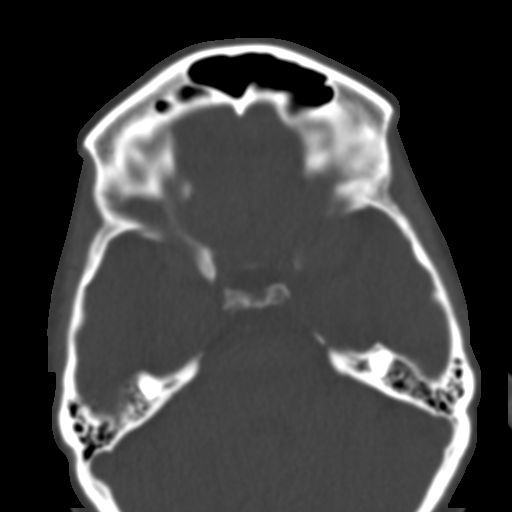
[im 39/48  bone]
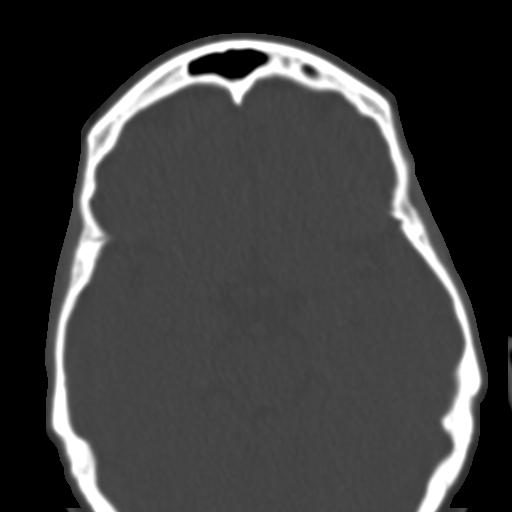
[im 44/48  brain]
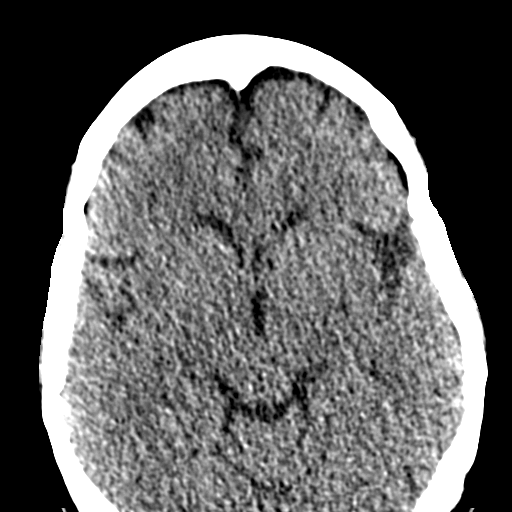
[im 44/48  bone]
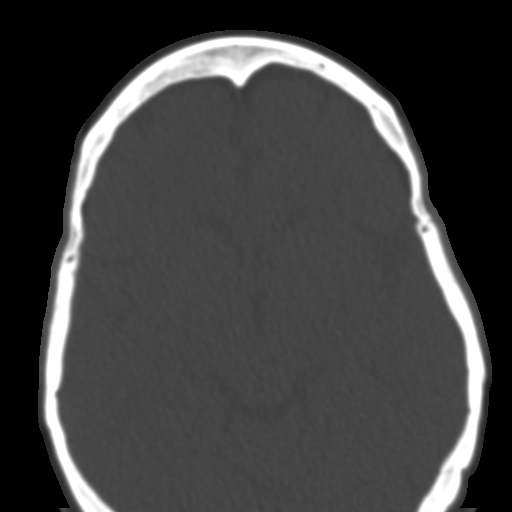

[Series 8: orbits 2.0 coronal · coronal · 0.22mm/px · 3 of 104 slices shown]
[im 35/104  bone]
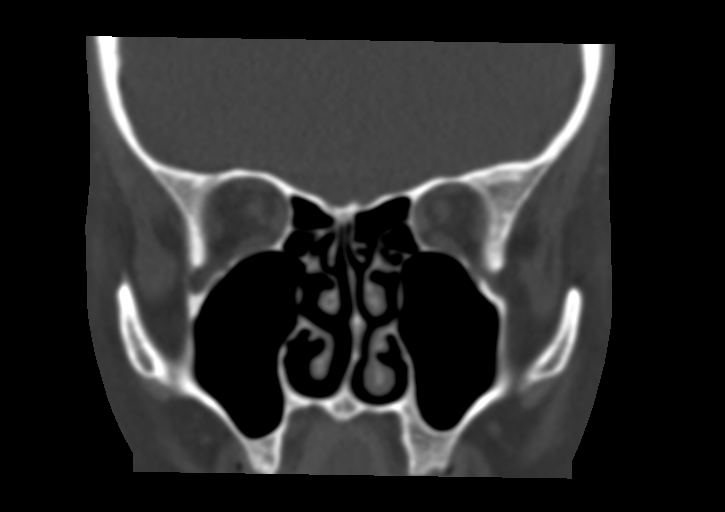
[im 46/104  bone]
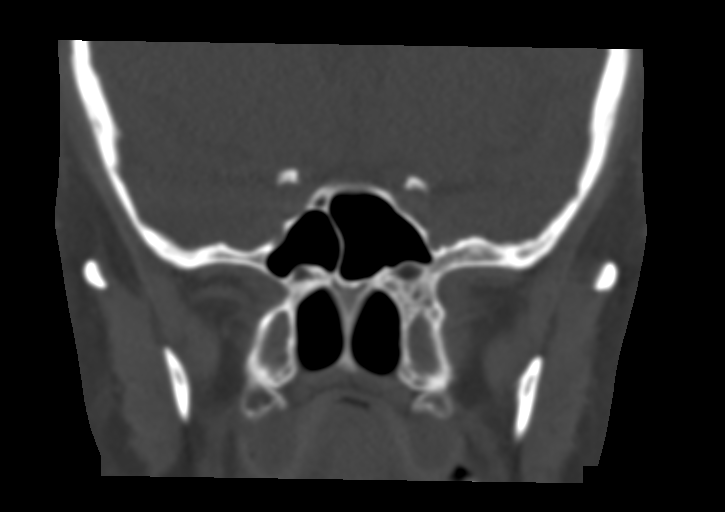
[im 58/104  bone]
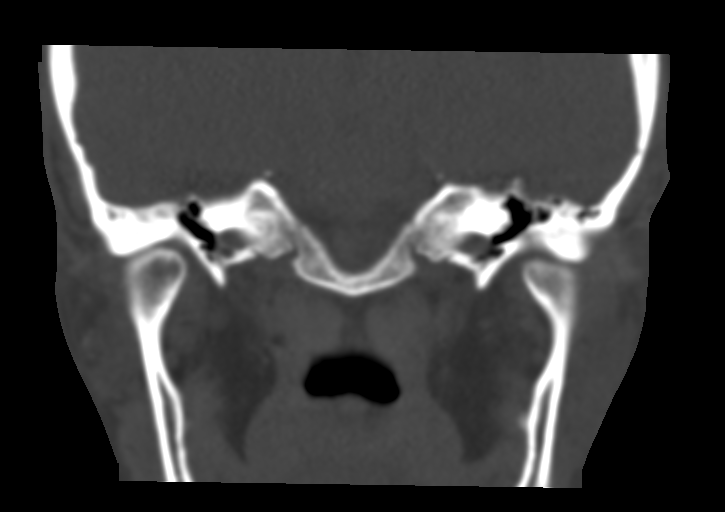

[Series 9: orbits 2.0 sagittal · sagittal · 0.22mm/px · 2 of 80 slices shown]
[im 27/80  bone]
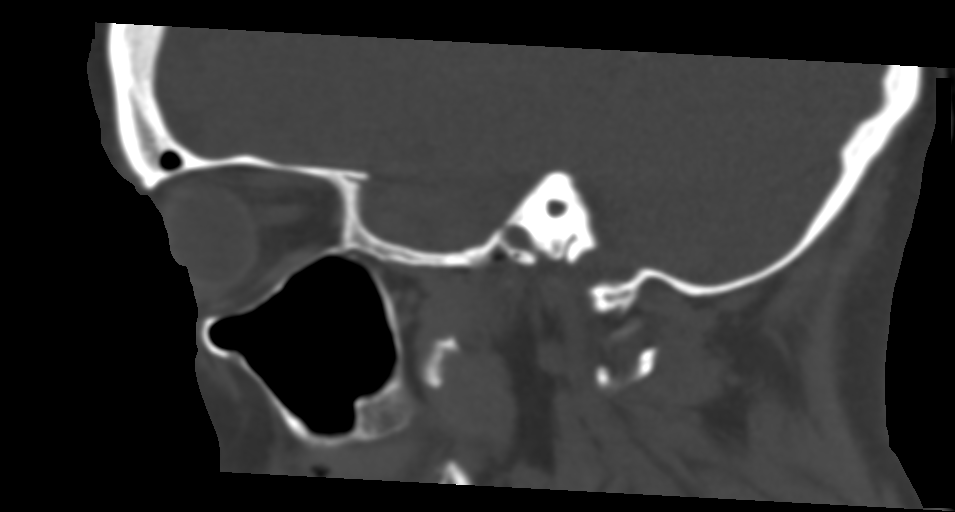
[im 53/80  bone]
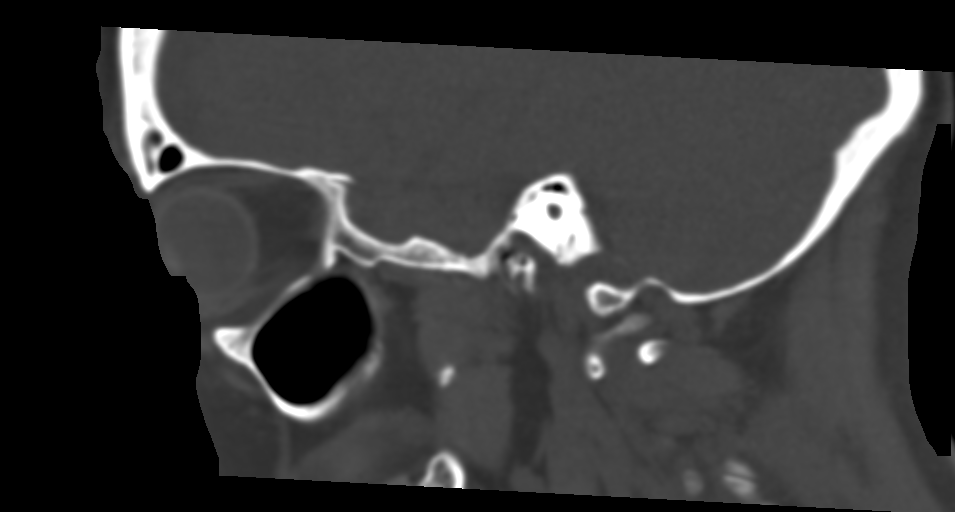

[14 of 47 positions shown; findings below may reference images not displayed]

FINDINGS: Orbits: No traumatic or inflammatory finding. Globes, optic nerves,
orbital fat, extraocular muscles, vascular structures, and lacrimal
glands are normal.

Visualized sinuses: Small mucous retention cyst in the left
maxillary sinus. The paranasal sinuses and mastoid air cells are
otherwise clear.

Soft tissues: Negative.

Limited intracranial: No significant or unexpected finding.
IMPRESSION: 1. Normal CT of the orbits.

## 2019-07-16 ENCOUNTER — Ambulatory Visit: Payer: Medicaid Other

## 2020-04-06 ENCOUNTER — Encounter: Payer: Self-pay | Admitting: Obstetrics and Gynecology

## 2020-04-06 ENCOUNTER — Ambulatory Visit (INDEPENDENT_AMBULATORY_CARE_PROVIDER_SITE_OTHER): Payer: Medicaid Other | Admitting: Obstetrics and Gynecology

## 2020-04-06 ENCOUNTER — Other Ambulatory Visit: Payer: Self-pay

## 2020-04-06 VITALS — BP 126/78 | Ht 60.0 in | Wt 156.0 lb

## 2020-04-06 DIAGNOSIS — Z0000 Encounter for general adult medical examination without abnormal findings: Secondary | ICD-10-CM | POA: Diagnosis not present

## 2020-04-06 DIAGNOSIS — Z1239 Encounter for other screening for malignant neoplasm of breast: Secondary | ICD-10-CM

## 2020-04-06 DIAGNOSIS — Z01419 Encounter for gynecological examination (general) (routine) without abnormal findings: Secondary | ICD-10-CM | POA: Diagnosis not present

## 2020-04-06 NOTE — Patient Instructions (Signed)
[facility] [address] [place] [address]  [facility]  [address] [place] [address]  Phone: [phone]  

## 2020-04-06 NOTE — Progress Notes (Signed)
Annual, no other concerns. RM 3

## 2020-04-06 NOTE — Progress Notes (Signed)
Gynecology Annual Exam  PCP: Etheleen Nicks, NP  Chief Complaint:  Chief Complaint  Patient presents with  . Gynecologic Exam    Annual, no other concerns. RM 3    History of Present Illness: Patient is a 39 y.o. No obstetric history on file. presents for annual exam. The patient has no complaints today.   LMP: No LMP recorded. Patient has had a hysterectomy.  Doing well since last visit.  Remains on liver transplant list although overall her liver function has remained stable.  She does have occasional issues with fluid retention and edema.  The patient does perform self breast exams.  There is no notable family history of breast or ovarian cancer in her family.  The patient wears seatbelts: yes.   The patient has regular exercise: not asked.    The patient denies current symptoms of depression.    Review of Systems: Review of Systems  Constitutional: Negative for chills and fever.  HENT: Negative for congestion.   Eyes: Negative.   Respiratory: Negative for cough and shortness of breath.   Cardiovascular: Positive for leg swelling. Negative for chest pain and palpitations.  Gastrointestinal: Negative for abdominal pain, constipation, diarrhea, heartburn, nausea and vomiting.  Genitourinary: Negative for dysuria, frequency and urgency.  Skin: Negative for itching and rash.  Neurological: Negative for dizziness and headaches.  Endo/Heme/Allergies: Negative for polydipsia.  Psychiatric/Behavioral: Negative for depression.    Past Medical History:  Patient Active Problem List   Diagnosis Date Noted  . Patellofemoral syndrome of left knee 01/13/2019  . Weight loss 12/15/2016  . History of hepatitis C 02/24/2016  . Elevated ferritin 02/24/2016  . 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 Controlled substance agreement signed 08/14/2015  . Thrombocytopenia (HCC) 07/05/2015  . Folate deficiency 07/05/2015  . Macrocytosis 06/05/2015  . ANA positive 06/05/2015   . Arthralgia of multiple sites 06/05/2015  . 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 Raynaud's phenomenon without gangrene 06/05/2015  . Chronic right shoulder pain 05/24/2015  . Chronic elbow pain 05/24/2015  . Chronic knee pain 05/24/2015  . 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 Hypothyroidism (acquired) 04/05/2015  . GERD without esophagitis 04/05/2015  . Allergic rhinitis 04/05/2015  . Situational anxiety 04/05/2015  . 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 Liver cirrhosis (HCC) 04/05/2015  . Left lower quadrant pain 04/05/2015  . History of ovarian cyst 04/05/2015  . ADHD (attention deficit hyperactivity disorder), combined type 04/05/2015  . Alcoholism /alcohol abuse 11/21/2012  . Hepatitis C, chronic (HCC) 11/21/2012  . Ovarian cyst 11/21/2012    Past Surgical History:  Past Surgical History:  Procedure Laterality Date  . ABDOMINAL HYSTERECTOMY    . APPENDECTOMY    . CHOLECYSTECTOMY      Gynecologic History:  No LMP recorded. Patient has had a hysterectomy. Contraception: status post hysterectomy Last Pap: Results were: N/A s/p prior hysterectomy  Obstetric History: No obstetric history on file.  Family History:  Family History  Problem Relation Age of Onset  . Hyperlipidemia Mother   . Cancer Father   . Hypertension Father   . Diabetes Father   . Hyperlipidemia Father     Social History:  Social History   Socioeconomic History  . Marital status: Widowed    Spouse name: Not on file  . Number of children: Not on file  . Years of education: Not on file  . Highest education level: Not on file  Occupational History  . Not on file  Tobacco Use  . Smoking status: Former Smoker    Packs/day: 0.50    Types: Cigarettes    Start date: 12/31/2000    Quit date: 07/06/2018    Years since quitting: 1.7  . Smokeless tobacco: Never Used  Vaping Use  . Vaping Use: Never used  Substance and Sexual Activity  . Alcohol use: Yes      Alcohol/week: 4.0 standard drinks    Types: 4 Shots of liquor per week    Comment: excessively  . Drug use: No  . Sexual activity: Not Currently  Other Topics Concern  . Not on file  Social History Narrative  . Not on file   Social Determinants of Health   Financial Resource Strain:   . Difficulty of Paying Living Expenses: Not on file  Food Insecurity:   . Worried About Programme researcher, broadcasting/film/video in the Last Year: Not on file  . Ran Out of Food in the Last Year: Not on file  Transportation Needs:   . Lack of Transportation (Medical): Not on file  . Lack of Transportation (Non-Medical): Not on file  Physical Activity:   . Days of Exercise per Week: Not on file  . Minutes of Exercise per Session: Not on file  Stress:   . Feeling of Stress : Not on file  Social Connections:   . Frequency of Communication with Friends and Family: Not on file  . Frequency of Social Gatherings with Friends and Family: Not on file  . Attends Religious Services: Not on file  . Active Member of Clubs or Organizations: Not on file  . Attends Banker Meetings: Not on file  . Marital Status: Not on file  Intimate Partner Violence:   . Fear of Current or Ex-Partner: Not on file  . Emotionally Abused: Not on file  . Physically Abused: Not on file  . Sexually Abused: Not on file    Allergies:  Allergies  Allergen Reactions  . Penicillins Rash    Medications: Prior to Admission medications   Medication Sig Start Date End Date Taking? Authorizing Provider  Cholecalciferol (VITAMIN D3) 50 MCG (2000 UT) capsule Take by mouth.   Yes [provider]  furosemide (LASIX) 40 MG tablet Take 40 mg by mouth.   Yes [provider]  levothyroxine (SYNTHROID, LEVOTHROID) 25 MCG tablet Take 25 mcg by mouth daily.   Yes [provider]  spironolactone (ALDACTONE) 25 MG tablet Take 25 mg by mouth daily.   Yes [provider]    Physical Exam Vitals: Blood pressure  126/78, height 5' (1.524 m), weight 156 lb (70.8 kg).  General: NAD, well nourished, appears stated age HEENT: normocephalic, anicteric Thyroid: no enlargement, no palpable nodules Pulmonary: No increased work of breathing, CTAB Cardiovascular: RRR, distal pulses 2+ Breast: Breast symmetrical, no tenderness, no palpable nodules or masses, no skin or nipple retraction present, no nipple discharge.  No axillary or supraclavicular lymphadenopathy. Abdomen: NABS, soft, non-tender, non-distended.  Umbilicus without lesions.  No hepatomegaly, splenomegaly or masses palpable. No evidence of hernia  Genitourinary:  External: Normal external female genitalia.  Normal urethral meatus, normal Bartholin's and Skene's glands.    Vagina: Normal vaginal mucosa, no evidence of prolapse.    Cervix: surgically absent  Uterus: surgically absent  Adnexa: ovaries non-enlarged, no adnexal masses  Rectal: deferred  Lymphatic: no evidence of inguinal lymphadenopathy Extremities: no edema, erythema, or tenderness Neurologic: Grossly intact Psychiatric: mood appropriate, affect full  Female chaperone present for pelvic  and breast  portions of the physical exam    Assessment: 39 y.o. No obstetric history on file. routine annual exam  Plan: Problem List Items Addressed This Visit    None    Visit Diagnoses    Breast screening    -  Primary   Relevant Orders   MM 3D SCREEN BREAST BILATERAL   Encounter for gynecological examination without abnormal finding          1) Mammogram - recommend yearly screening mammogram.  Mammogram Was ordered today   2) STI screening  was notoffered and therefore not obtained  3) ASCCP guidelines and rational discussed.  Patient opts for discontinue secondary to prior hysterectomy screening interval  4) Contraception - the patient is currently using  status post hysterectomy.  She is not currently in need of contraception secondary to being sterile  5) Colonoscopy --  Screening recommended starting at age 66 for average risk individuals, age 50 for individuals deemed at increased risk (including African Americans) and recommended to continue until age 45.  For patient age 37-85 individualized approach is recommended.  Gold standard screening is via colonoscopy, Cologuard screening is an acceptable alternative for patient unwilling or unable to undergo colonoscopy.  "Colorectal cancer screening for average?risk adults: 2018 guideline update from the American Cancer Society"CA: A Cancer Journal for Clinicians: Oct 02, 2016   6) Routine healthcare maintenance including cholesterol, diabetes screening discussed managed by PCP  7) Return in about 1 year (around 04/06/2021) for annual.   Vena Austria, MD, [facility], [facility] 04/06/2020, 1:42 PM

## 2021-03-06 ENCOUNTER — Other Ambulatory Visit: Payer: Self-pay

## 2021-03-06 MED ORDER — DEXTROAMPHETAMINE SULFATE 10 MG PO TABS
ORAL_TABLET | ORAL | 0 refills | Status: AC
  Filled 2021-03-06 – 2021-03-09 (×2): qty 60, 30d supply, fill #0

## 2021-03-09 ENCOUNTER — Other Ambulatory Visit: Payer: Self-pay

## 2021-03-21 ENCOUNTER — Other Ambulatory Visit: Payer: Self-pay

## 2021-03-21 MED ORDER — CARESTART COVID-19 HOME TEST VI KIT
PACK | 0 refills | Status: AC
  Filled 2021-03-21: qty 2, 4d supply, fill #0

## 2021-05-01 ENCOUNTER — Other Ambulatory Visit: Payer: Self-pay

## 2021-05-01 MED ORDER — AMPHETAMINE-DEXTROAMPHETAMINE 10 MG PO TABS
ORAL_TABLET | ORAL | 0 refills | Status: AC
  Filled 2021-05-01 – 2021-05-03 (×2): qty 60, 30d supply, fill #0

## 2021-05-01 MED ORDER — AMPHETAMINE-DEXTROAMPHETAMINE 10 MG PO TABS: ORAL_TABLET | ORAL | 0 refills | Status: DC

## 2021-05-03 ENCOUNTER — Other Ambulatory Visit: Payer: Self-pay

## 2022-03-14 ENCOUNTER — Other Ambulatory Visit: Payer: Self-pay | Admitting: Family

## 2022-03-14 ENCOUNTER — Ambulatory Visit
Admission: RE | Admit: 2022-03-14 | Discharge: 2022-03-14 | Disposition: A | Discharge status: Home / Self Care | Payer: Medicaid Other | Source: Ambulatory Visit | Attending: Family | Admitting: Family

## 2022-03-14 ENCOUNTER — Ambulatory Visit
Admission: RE | Admit: 2022-03-14 | Discharge: 2022-03-14 | Disposition: A | Discharge status: Home / Self Care | Payer: Medicaid Other | Attending: Family | Admitting: Family

## 2022-03-14 DIAGNOSIS — M25561 Pain in right knee: Secondary | ICD-10-CM | POA: Diagnosis present

## 2023-03-06 ENCOUNTER — Ambulatory Visit: Payer: Self-pay
